# Patient Record
Sex: Female | Born: 1950
Health system: Southern US, Community
[De-identification: ages and names within clinical notes are randomized; demographics above are authoritative.]

## PROBLEM LIST (undated history)

## (undated) DIAGNOSIS — J45909 Unspecified asthma, uncomplicated: Secondary | ICD-10-CM

## (undated) DIAGNOSIS — C4491 Basal cell carcinoma of skin, unspecified: Secondary | ICD-10-CM

## (undated) DIAGNOSIS — K5900 Constipation, unspecified: Secondary | ICD-10-CM

## (undated) DIAGNOSIS — R7309 Other abnormal glucose: Secondary | ICD-10-CM

## (undated) DIAGNOSIS — I7300 Raynaud's syndrome without gangrene: Secondary | ICD-10-CM

## (undated) DIAGNOSIS — M1990 Unspecified osteoarthritis, unspecified site: Secondary | ICD-10-CM

## (undated) DIAGNOSIS — M5136 Other intervertebral disc degeneration, lumbar region: Secondary | ICD-10-CM

## (undated) DIAGNOSIS — R51 Headache: Secondary | ICD-10-CM

## (undated) DIAGNOSIS — G4700 Insomnia, unspecified: Secondary | ICD-10-CM

## (undated) DIAGNOSIS — K589 Irritable bowel syndrome without diarrhea: Secondary | ICD-10-CM

## (undated) DIAGNOSIS — R5383 Other fatigue: Secondary | ICD-10-CM

## (undated) DIAGNOSIS — M8580 Other specified disorders of bone density and structure, unspecified site: Secondary | ICD-10-CM

## (undated) DIAGNOSIS — F419 Anxiety disorder, unspecified: Secondary | ICD-10-CM

## (undated) DIAGNOSIS — F329 Major depressive disorder, single episode, unspecified: Secondary | ICD-10-CM

## (undated) DIAGNOSIS — F32A Depression, unspecified: Secondary | ICD-10-CM

## (undated) DIAGNOSIS — R519 Headache, unspecified: Secondary | ICD-10-CM

## (undated) DIAGNOSIS — K635 Polyp of colon: Secondary | ICD-10-CM

## (undated) DIAGNOSIS — M779 Enthesopathy, unspecified: Secondary | ICD-10-CM

## (undated) DIAGNOSIS — H9313 Tinnitus, bilateral: Secondary | ICD-10-CM

## (undated) DIAGNOSIS — L719 Rosacea, unspecified: Secondary | ICD-10-CM

## (undated) DIAGNOSIS — M51369 Other intervertebral disc degeneration, lumbar region without mention of lumbar back pain or lower extremity pain: Secondary | ICD-10-CM

## (undated) DIAGNOSIS — E785 Hyperlipidemia, unspecified: Secondary | ICD-10-CM

## (undated) DIAGNOSIS — G8929 Other chronic pain: Secondary | ICD-10-CM

## (undated) DIAGNOSIS — E871 Hypo-osmolality and hyponatremia: Secondary | ICD-10-CM

## (undated) DIAGNOSIS — G629 Polyneuropathy, unspecified: Secondary | ICD-10-CM

## (undated) HISTORY — PX: VAGINAL HYSTERECTOMY: SUR661

## (undated) HISTORY — PX: DIGIT NAIL REMOVAL: SHX5052

## (undated) HISTORY — DX: Constipation, unspecified: K59.00

## (undated) HISTORY — DX: Rosacea, unspecified: L71.9

## (undated) HISTORY — DX: Other specified disorders of bone density and structure, unspecified site: M85.80

## (undated) HISTORY — DX: Hypo-osmolality and hyponatremia: E87.1

## (undated) HISTORY — DX: Insomnia, unspecified: G47.00

## (undated) HISTORY — DX: Basal cell carcinoma of skin, unspecified: C44.91

## (undated) HISTORY — DX: Headache: R51

## (undated) HISTORY — DX: Enthesopathy, unspecified: M77.9

## (undated) HISTORY — PX: CARDIAC CATHETERIZATION: SHX172

## (undated) HISTORY — DX: Irritable bowel syndrome, unspecified: K58.9

## (undated) HISTORY — DX: Major depressive disorder, single episode, unspecified: F32.9

## (undated) HISTORY — DX: Polyneuropathy, unspecified: G62.9

## (undated) HISTORY — DX: Tinnitus, bilateral: H93.13

## (undated) HISTORY — DX: Other abnormal glucose: R73.09

## (undated) HISTORY — DX: Unspecified asthma, uncomplicated: J45.909

## (undated) HISTORY — DX: Other intervertebral disc degeneration, lumbar region without mention of lumbar back pain or lower extremity pain: M51.369

## (undated) HISTORY — PX: DENTAL SURGERY: SHX609

## (undated) HISTORY — DX: Anxiety disorder, unspecified: F41.9

## (undated) HISTORY — DX: Other intervertebral disc degeneration, lumbar region: M51.36

## (undated) HISTORY — DX: Headache, unspecified: R51.9

## (undated) HISTORY — PX: BUNIONECTOMY: SHX129

## (undated) HISTORY — DX: Raynaud's syndrome without gangrene: I73.00

## (undated) HISTORY — DX: Polyp of colon: K63.5

## (undated) HISTORY — DX: Depression, unspecified: F32.A

## (undated) HISTORY — DX: Other fatigue: R53.83

## (undated) HISTORY — DX: Hyperlipidemia, unspecified: E78.5

## (undated) HISTORY — PX: BREAST REDUCTION SURGERY: SHX8

## (undated) HISTORY — DX: Other chronic pain: G89.29

## (undated) HISTORY — PX: FACIAL COSMETIC SURGERY: SHX629

## (undated) HISTORY — DX: Unspecified osteoarthritis, unspecified site: M19.90

---

## 2000-10-13 ENCOUNTER — Other Ambulatory Visit: Admission: RE | Admit: 2000-10-13 | Discharge: 2000-10-13 | Payer: Self-pay | Admitting: Family Medicine

## 2001-03-16 ENCOUNTER — Ambulatory Visit (HOSPITAL_COMMUNITY): Admission: RE | Admit: 2001-03-16 | Discharge: 2001-03-16 | Payer: Self-pay | Admitting: Gastroenterology

## 2001-12-06 ENCOUNTER — Other Ambulatory Visit: Admission: RE | Admit: 2001-12-06 | Discharge: 2001-12-06 | Payer: Self-pay | Admitting: Family Medicine

## 2002-11-05 ENCOUNTER — Other Ambulatory Visit: Admission: RE | Admit: 2002-11-05 | Discharge: 2002-11-05 | Payer: Self-pay | Admitting: Family Medicine

## 2002-11-05 ENCOUNTER — Encounter: Admission: RE | Admit: 2002-11-05 | Discharge: 2002-11-05 | Payer: Self-pay | Admitting: Family Medicine

## 2002-11-05 ENCOUNTER — Encounter: Payer: Self-pay | Admitting: Family Medicine

## 2003-03-26 ENCOUNTER — Ambulatory Visit (HOSPITAL_COMMUNITY): Admission: RE | Admit: 2003-03-26 | Discharge: 2003-03-26 | Payer: Self-pay | Admitting: Plastic Surgery

## 2004-01-20 ENCOUNTER — Other Ambulatory Visit: Admission: RE | Admit: 2004-01-20 | Discharge: 2004-01-20 | Payer: Self-pay | Admitting: Family Medicine

## 2004-03-10 ENCOUNTER — Ambulatory Visit (HOSPITAL_COMMUNITY): Admission: RE | Admit: 2004-03-10 | Discharge: 2004-03-10 | Payer: Self-pay | Admitting: Orthopedic Surgery

## 2004-03-10 ENCOUNTER — Encounter (INDEPENDENT_AMBULATORY_CARE_PROVIDER_SITE_OTHER): Payer: Self-pay | Admitting: Specialist

## 2004-03-10 ENCOUNTER — Ambulatory Visit (HOSPITAL_BASED_OUTPATIENT_CLINIC_OR_DEPARTMENT_OTHER): Admission: RE | Admit: 2004-03-10 | Discharge: 2004-03-10 | Payer: Self-pay | Admitting: Orthopedic Surgery

## 2005-06-22 ENCOUNTER — Encounter: Admission: RE | Admit: 2005-06-22 | Discharge: 2005-06-22 | Payer: Self-pay | Admitting: Cardiology

## 2005-06-29 ENCOUNTER — Ambulatory Visit (HOSPITAL_COMMUNITY): Admission: RE | Admit: 2005-06-29 | Discharge: 2005-06-29 | Payer: Self-pay | Admitting: Cardiology

## 2005-07-07 ENCOUNTER — Ambulatory Visit (HOSPITAL_COMMUNITY): Admission: RE | Admit: 2005-07-07 | Discharge: 2005-07-07 | Payer: Self-pay | Admitting: Cardiology

## 2006-04-04 ENCOUNTER — Ambulatory Visit (HOSPITAL_COMMUNITY): Admission: RE | Admit: 2006-04-04 | Discharge: 2006-04-04 | Payer: Self-pay | Admitting: Plastic Surgery

## 2006-04-16 ENCOUNTER — Encounter (INDEPENDENT_AMBULATORY_CARE_PROVIDER_SITE_OTHER): Payer: Self-pay | Admitting: Plastic Surgery

## 2006-04-16 ENCOUNTER — Inpatient Hospital Stay (HOSPITAL_COMMUNITY): Admission: AD | Admit: 2006-04-16 | Discharge: 2006-04-19 | Payer: Self-pay | Admitting: Plastic Surgery

## 2006-11-16 ENCOUNTER — Ambulatory Visit (HOSPITAL_COMMUNITY): Admission: RE | Admit: 2006-11-16 | Discharge: 2006-11-16 | Payer: Self-pay | Admitting: Plastic Surgery

## 2009-06-04 ENCOUNTER — Encounter: Admission: RE | Admit: 2009-06-04 | Discharge: 2009-06-04 | Payer: Self-pay | Admitting: Family Medicine

## 2010-03-10 ENCOUNTER — Other Ambulatory Visit: Payer: Self-pay | Admitting: Dermatology

## 2010-06-11 NOTE — H&P (Signed)
NAME:  Nichole [name], Nichole [name]                     ACCOUNT NO.:  [id number]   MEDICAL RECORD NO.:  [id number]          PATIENT TYPE:  INP   LOCATION:  9319                          FACILITY:  [facility]   PHYSICIAN:  Lorra Hals, M.D.   DATE OF BIRTH:  [birth year]   DATE OF ADMISSION:  04/16/2006  DATE OF DISCHARGE:                              HISTORY & PHYSICAL   CHIEF COMPLAINT:  Life threatening wound of the right and left breast,  secondary to breast reduction performed 4 days ago.   HISTORY OF PRESENT ILLNESS:  History regarding breast reduction:  The  patient was seen in consultation regarding the classic symptoms of  macromastia, pain in the breast with and without her bra, pain in the  neck, back, shoulders and breasts due to abnormal posture. Painful  grooving and then abrasive dermatitis in the bra shoulder strap area  secondary to the massive weight of the breast being transposed to the  shoulder area, plus a chronic recurrent dermatitis in the infra-mammary  sulcus bilaterally. This procedure would have been covered by insurance,  if the insurance carriers no longer placed rather questionable  restrictions on their pre-authorization at this point in time. 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 This lady is 5 feet tall and weighs 120 pounds and  her breasts are quite massive for her small height and weight.   The breast reduction procedure performed 4 days ago was performed in the  office surgery center of Dr. Marijean Niemann, a reconstructive plastic  surgeon. The technique used was the typically performed procedure,  performed in [place], that being a de-epithelialized inferiorly  based pedicle, utilized for nipple areolar transposition. During the  procedure, the pedicle was maintained at a greater than adequate width  and thickness, and no torsion or  compression was placed on the pedicle  when the wounds were closed. In the postoperative period, the breasts  neither swelled abnormally, though there was not any additional  compression on the pedicle. The patient did, however, develop aggressive  cyanosis and ultimately ischemic necrosis of the right and left nipple  areolar complexes, and she was therefore admitted for urgent surgery for  wound exploration and debridement and to be begun on a wound VAC system  treatment.   REVIEW OF SYSTEMS:  HEENT:  No prior history of meningitis, seizures,  head injury, paralysis, stroke, vision difficulty, glaucoma, nose bleed,  or thyroid disorder.  HEART/LUNG:  No history of asthma, bronchitis, pneumonia, emphysema,  hemoptysis, shortness of breath, chest pain, angina, ankle swelling,  hypertension, or heart murmur.  ABDOMEN:  No history of gastric reflux, hiatal hernia, ulcers,  hematemesis, black or bloody stools, hepatitis, jaundice, gallbladder  disorder.  GENITOURINARY:  No difficulty in voiding, bloody urine, kidney disease,  or stones. The patient is a G2, P2, A0.   PAST MEDICAL HISTORY:  No major childhood diseases. No major adult  diseases.   PAST SURGICAL  HISTORY:  Wisdom teeth 1969. Hysterectomy in 1980. Bunion  in 2001. Facelift in 2005.   ALLERGIES:  None other than CODEINE (causes hallucinations.)   CURRENT MEDICATIONS:  Crestor 30 mg daily.   SOCIAL HISTORY:  The patient is married.   HABITS:  The patient does not smoke. Only drinks very rarely.   FAMILY HISTORY:  Father 58, deceased of lung and liver cancer. Mother  40, deceased. Two sisters, one 71 and one 69, both alive and well.   PHYSICAL EXAMINATION:  VITAL SIGNS:  Pulse 72, blood pressure 124/78,  respiratory rate 12. The patient is afebrile.  HEENT:  PERRLA. Nose clear. Oral clear.  NECK:  No thyroid enlargement or bruits.  BREASTS:  The patient presents with primary healing of the vertical  infra-areolar and  transverse infra-mammary wound incisions. There is  some ecchymosis and abnormal capillary filling of the lower breast  flaps. Both the right and left nipple areolar complexes demonstrate  ischemic necrosis.  LUNGS:  Clear to auscultation and percussion.  HEART:  Regular rhythm without murmurs. Size normal.  ABDOMEN:  No hernia. No liver or spleen enlargement. No adenopathy.  EXTREMITIES:  Within normal limits.  NEUROLOGIC:  Sensory sensorium, cerebellar, and DTR's are normal.   FINAL IMPRESSION:  The patient is 4 days postoperative bilateral breast  reduction for macromastia, for relief of pain symptoms noted above. The  patient has suffered ischemic necrosis of both right and left nipple  areolar complexes and is admitted for appropriate wound debridement and  wound management.     ______________________________  Lorra Hals, M.D.    ______________________________  Lorra Hals, M.D.    TRK/MEDQ  D:  04/16/2006  T:  04/16/2006  Job:  [id number]   cc:   Lorra Hals, M.D.  Fax: [phone]

## 2010-06-11 NOTE — Op Note (Signed)
[name], [name]                     ACCOUNT NO.:  [id number]   MEDICAL RECORD NO.:  [id number]          PATIENT TYPE:  AMB   LOCATION:  DSC                          FACILITY:  [facility]   PHYSICIAN:  Cindee Salt, M.D.       DATE OF BIRTH:  1950-05-14   DATE OF PROCEDURE:  03/10/2004  DATE OF DISCHARGE:                                 OPERATIVE REPORT   PREOPERATIVE DIAGNOSIS:  Mucoid cyst left thumb.   POSTOPERATIVE DIAGNOSIS:  Mucoid cyst left thumb.   OPERATION:  Excision mucoid cyst, debridement interphalangeal joint left  thumb.   SURGEON:  Cindee Salt, M.D.   ASSISTANTCarolyne Fiscal.   ANESTHESIA:  General.   HISTORY:  The patient is a 60 year old female with a history a large mass  dorsal aspect IP joint of her left thumb. X-rays revealed degenerative  arthritis.   PROCEDURE:  The patient is brought to the operating room where a general  anesthetic was carried out without difficulty. She was prepped using  DuraPrep, supine position, left arm free. The limb was exsanguinated with an  Esmarch bandage, tourniquet placed on the arm was inflated to 250 mmHg.  Curvilinear incision was made over the mass carried down on the radial  aspect of the proximal phalanx, carried down through subcutaneous tissue. A  large multilobulated cystic structure was immediately encountered the joint  was significantly swollen.  The cyst was excised with a rongeur. The joint  was opened and the radial aspect. The synovial tissue removed which was  abundant.  A large exostosis was present on the dorsal aspect of the  proximal phalanx with a small rongeur. This was removed without difficulty.  Smoothed over the dorsal aspect.  No further exostoses were identified. Both  specimens were sent to pathology. The wound was irrigated. The skin was then  closed interrupted 5-0 nylon sutures. Sterile compressive dressing and  splint to the thumb and was applied. The patient tolerated the procedure  well was taken to  the recovery observation in satisfactory condition. She is  discharged home to return to [facility] in one week on  Vicodin.      GK/MEDQ  D:  03/10/2004  T:  03/10/2004  Job:  [id number]

## 2010-06-11 NOTE — Cardiovascular Report (Signed)
NAMEREMI, RESTER                     ACCOUNT NO.:  [id number]   MEDICAL RECORD NO.:  [id number]          PATIENT TYPE:  OIB   LOCATION:  2899                         FACILITY:  [facility]   PHYSICIAN:  Thereasa Solo. Little, M.D. DATE OF BIRTH:  Jan 30, 1950   DATE OF PROCEDURE:  06/29/2005  DATE OF DISCHARGE:                              CARDIAC CATHETERIZATION   INDICATIONS FOR TEST:  This healthy 60 year old female who works out  regularly has had no problems with any limitations until about a month ago  when she began having marked dyspnea on exertion when she tried to hike up  [employer]  She had done this multiple times in the past with no  limitations.  Since that time, she has had dyspnea on exertion when she does  her normal without routine which includes using a treadmill.  She does not  have a history of COPD, does not smoke cigarettes and has no family history  of heart disease.   Her LDL was elevated at 151 on May 04, 2005.  She was started on both  Crestor and Niacin.   Patient is brought in for outpatient cardiac catheterization.   PROCEDURE:  After obtaining informed consent, the patient was prepped and  draped in the usual sterile fashion exposing the right groin.  Following  local anesthetic with 1% Xylocaine, the Seldinger technique was employed and  a 5 [place] introducer sheath was placed in the right femoral artery.  Left  and right coronary arteriography and ventriculography in the RAO projection  was performed.   COMPLICATIONS:  None.   EQUIPMENT:  5 French Judkins configuration catheters.   TOTAL CONTRAST:  70 mL.   MEDICATIONS:  IV Benadryl 25 mg   RESULTS:  1.  Hemodynamic monitoring:  Central aortic pressure was 126/63, left      ventricular pressure was 126/13 with no aortic valve gradient noted at      the time of pullback.  2.  Ventriculography:  Ventriculography in the RAO projection using 20 mL of      contrast at 12 mL per second revealed  normal left ventricular systolic      function.  No wall motion abnormalities were noted.  No mitral      regurgitation was seen.  The left ventricular end diastolic pressure was      9 and the ejection fraction was greater than 60%.  3.  Coronary arteriography:  On fluoroscopy, no calcification was noted in      the distribution of the coronary arteries.      1.  Left main normal.  It bifurcated.      2.  LAD:  The LAD approached the apex of the heart.  It was a large          vessel.  Just after the takeoff of the first diagonal was a focal          eccentric area of narrowing in the LAD.  It was not flow limiting.          The first  diagonal was a very large vessel and free of disease.      3.  Circumflex:  The circumflex was a very large vessel that gave rise          to a hybrid bifurcating OM system, all of which was free of disease.      4.  Right coronary artery:  The right coronary artery was a dominant          vessel that gave rise to a PDA and a large posterolateral vessel          that bifurcated.  It was free of disease.   CONCLUSION:  1.  Normal left ventricular systolic function.  2.  Minimal narrowing of the left anterior descending just after the      bifurcation of the first diagonal.  3.  Normotensive.   I cannot explain her dyspnea on exertion from a cardiac standpoint.  I will  order pulmonary function studies with and without bronchodilators as an  outpatient.  She will need her LDL to be rechecked in about four weeks and  my goal would be an LDL of 70.           ______________________________  Thereasa Solo. Little, M.D.     ABL/MEDQ  D:  06/29/2005  T:  06/29/2005  Job:  [id number]   cc:   Cardiac Cath Lab   Talmadge Coventry, M.D.  Fax: [phone]

## 2010-06-11 NOTE — Op Note (Signed)
NAME:  Nichole [name], Nichole [name]                     ACCOUNT NO.:  [id number]   MEDICAL RECORD NO.:  [id number]          PATIENT TYPE:  INP   LOCATION:  9319                          FACILITY[facility]   PHYSICIAN:  Lorra Hals, M.D.   DATE OF BIRTH:  Sep 26, 1950   DATE OF PROCEDURE:  DATE OF DISCHARGE:                               OPERATIVE REPORT   A 60 year old patient.   PREOPERATIVE DIAGNOSIS:  Patient suffers from bilateral necrosis of  nipple areolar pedicle utilized for bilateral breast reduction,  approximately seven days ago.  Forty eight hours ago, she underwent  debridement of the nipple areolar pedicle tissues on the right and left  breast and activation of a wound vac system.  She has rested well thus  far with minimal drainage from the right and left breast wounds.  Quiescent, noninflamed with no signs of infection.  The patient has been  afebrile during her hospital course.   POSTOPERATIVE DIAGNOSIS:  Patient suffers from bilateral necrosis of  nipple areolar pedicle utilized for bilateral breast reduction,  approximately seven days ago.  The wound is extremely clean.  There is  no evidence of any bacteria or infection.   OPERATION:  1. Removal of previously placed wound vac system.  2. Wound exploration with very minimal debridement and reapplication      of wound vac system.   ANESTHESIA:  General via LMA.   SURGEON:  Lorra Hals, M.D.   PROCEDURE:  After a satisfactory level of general anesthesia was  achieved, the wound vac system was removed, and the entire anterior  chest prepped with multiple layers of Betadine, and a sterile field  created about the anterior chest.   FINDINGS:  This lady presents with a 4.5 x 4 x 2 to 2.5 cm deep open  wound at the nipple areolar recipient site of the right and left breast.  The underlying tissues, which are primarily fatty tissues, are viable  with no evidence of any progressive process from her previous  debridement 48 hours ago.   The wound cavities are explored thoroughly,  irrigated with copious amount of saline, and the wound was then repacked  with the black wound foam deep, and the white wound foam superficially.  Sticky drapes are then applied to close the wound vac system.  The  patient tolerated the procedure well.  Estimated blood loss 5 cc.           ______________________________  Lorra Hals, M.D.     TRK/MEDQ  D:  04/18/2006  T:  04/18/2006  Job:  [id number]

## 2010-06-11 NOTE — Discharge Summary (Signed)
NAME:  Nichole [name], Nichole [name]                     ACCOUNT NO.:  [id number]   MEDICAL RECORD NO.:  [id number]          PATIENT TYPE:  INP   LOCATION:  9319                          [facility]   PHYSICIAN:  Lorra Hals, M.D.   DATE OF BIRTH:  October 02, 1950   DATE OF ADMISSION:  04/16/2006  DATE OF DISCHARGE:  04/19/2006                               DISCHARGE SUMMARY   CHIEF COMPLAINT:  Life-threatening wound, right and left breasts  secondary to breast reduction four days prior to admission.   HISTORY OF PRESENT ILLNESS:  The patient was seen in consultation  regarding the classic symptoms of macromastia, pain in the breast with  and without her bra, pain in the neck, back, and shoulders, and breasts  due to the weight of the breast and her abnormal posture secondary to  the weight of the breast.  Painful grooving plus and abrasive type  dermatitis in the bra shoulder strap area secondary to the massive  weight of the breasts being transposed to the bra shoulder strap area,  plus a chronic recurrent dermatitis in the inframammary sulcus  bilaterally during the warm summer months.  This lady is 5 feet tall and  weighs 120 pounds and her breasts are quite massive for her short height  and weight.  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  On April 12, 2006, the patient underwent a classic keyhole mastopexy  utilizing an inferiorly based de-epithelialized pedicle for nipple  areolar transposition.  She then developed ischemia of the pedicle and  ultimately developed necrosis of the tip of the pedicle utilized for  nipple areolar transposition.  She was then admitted on  April 16, 2006,  for urgent wound exploration and appropriate treatment.   HOSPITAL COURSE:  On the day of the admission, the patient was taken to  the operating theater and with Dr. Aquilla Hacker assisting, under general  anesthesia, both wounds were explored.  The distal third of each pedicle  utilized for nipple areolar transposition was necrotic and both were  debrided.  The patient was then placed on a Wound VAC system and  appropriate wound cultures sent for aerobic and anaerobic cultures.  The patient remained afebrile postoperatively for the first 48 hours and  was returned on April 18, 2006, for secondary wound exploration.  Under  general anesthesia the VAC system was removed and the wounds re-explored  and further debridement carried out until all tissues appeared to be  viable.  She was then re-placed on the Wound VAC system. All aerobic and  anaerobic cultures remained negative for the following 24 hours after  her second surgery.  She remained afebrile and the wounds remained  quiescent, and she was then ready for discharge.   LABORATORY  FINDINGS:  Pathology report revealed devitalized skin with  vascular congestion and inflammation with no evidence of malignancy on  either of the products of the debridement of the primary and secondary  debridement surgeries.  Admission white count 8, hemoglobin 12.8.   FINAL DIAGNOSIS:  Secondary wound necrosis secondary to breast reduction  requiring surgical debridement and wound care via Wound VAC system.   PLAN:  Monday, Wednesday and Friday [facility] system changed and followup care  per the office of Dr. Marijean Niemann.           ______________________________  Lorra Hals, M.D.     TRK/MEDQ  D:  04/28/2006  T:  04/28/2006  Job:  [id number]

## 2010-06-11 NOTE — Op Note (Signed)
NAME:  Nichole [name], Nichole [name]                     ACCOUNT NO.:  [id number]   MEDICAL RECORD NO.:  [id number]          PATIENT TYPE:  INP   LOCATION:  9198                          [facility]   PHYSICIAN:  Lorra Hals, M.D.   DATE OF BIRTH:  [birth year]   DATE OF PROCEDURE:  04/16/2006  DATE OF DISCHARGE:                               OPERATIVE REPORT   A 60 year old female.   PREOPERATIVE DIAGNOSIS:  This lady underwent a bilateral breast  reduction for pain in the breasts, pain in the neck, back and the  shoulders secondary to the weight of the breasts, deep grooving and pain  in the shoulder bra strap area secondary to the weight of the breasts,  plus a chronic recurrent dermatitis in the inframammary sulcus of both  breasts.  This breast reduction procedure was carried out four days ago  utilizing an inferiorly based deepithelialized pedicle for nipple  areolar transposition.  Postoperatively, she developed cyanosis of the  nipple areolar complex and was begun on one adult aspirin a day and 5000  units of USP heparin and topical 2% nitroglycerin cream.  In spite of  these measures, these tissues have gone on to become severely ischemic.   POSTOPERATIVE DIAGNOSIS:  Ischemic necrosis of the distal half of the  pedicle/nipple areolar complex.   OPERATION:  Exploration of the right and left breast with debridement of  necrotic tissue and fixation of wound VAC device to the right and the  left breast.   ANESTHESIA:  General via LMA.   SURGEON:  Kitchens.   ASSISTANT SURGEON:  Dr. Aquilla Hacker.   PROCEDURE:  After a satisfactory level of general anesthesia was  achieved, the entire anterior chest was prepped with multiple layers of  Betadine and a sterile field created about the anterior chest.   FINDINGS:  The incisional wounds in the transverse inframammary sulcus  and infra-areolar wounds are healing per primum, though some mild  ecchymoses and poor circulation is visible in these  flaps, which is  quite unusual.  Both the right and the left nipple areolar complexes are  quite dusky and exhibit no significant circulation when pricked with an  18 gauge needle.   The first stage of the procedure consisted of removing the sutures about  each nipple areolar complex and the nipple recipient site.  The pedicles  were then advanced out of these wounds and very slowly and meticulously  debrided of nonviable tissues until an end point was reached in both the  right and left pedicle, which showed normal bleeding/viability.  Approximately the distal half of each pedicle was resected along with  the entire nipple areolar complex on both the right and left breast.  Aerobic and anaerobic cultures were taken of both the right and left  breast.   The wounds were irrigated with a copious amount of saline.  Very little  bleeding was noted even from the normal non-pedicle tissues, which is  somewhat unusual since the patient has been on aspirin for the last 4  days.   Next, a  wound VAC sponge was placed within each nipple areolar recipient  site wound and the appropriate sticky drapes applied.  This wound will  be treated with the wound VAC system postop.   The patient tolerated the procedure well.   ESTIMATED BLOOD LOSS:  10 mL.           ______________________________  Lorra Hals, M.D.     TRK/MEDQ  D:  04/16/2006  T:  04/16/2006  Job:  [id number]   cc:   Aquilla Hacker, M.D.  Fax: [phone]

## 2011-10-06 DIAGNOSIS — R7301 Impaired fasting glucose: Secondary | ICD-10-CM | POA: Insufficient documentation

## 2011-10-06 DIAGNOSIS — G479 Sleep disorder, unspecified: Secondary | ICD-10-CM | POA: Insufficient documentation

## 2011-10-06 DIAGNOSIS — I7300 Raynaud's syndrome without gangrene: Secondary | ICD-10-CM | POA: Insufficient documentation

## 2011-10-06 DIAGNOSIS — E785 Hyperlipidemia, unspecified: Secondary | ICD-10-CM | POA: Insufficient documentation

## 2011-10-06 DIAGNOSIS — G4700 Insomnia, unspecified: Secondary | ICD-10-CM | POA: Insufficient documentation

## 2011-10-06 DIAGNOSIS — F419 Anxiety disorder, unspecified: Secondary | ICD-10-CM | POA: Insufficient documentation

## 2011-10-06 DIAGNOSIS — K5909 Other constipation: Secondary | ICD-10-CM | POA: Insufficient documentation

## 2012-05-02 DIAGNOSIS — M778 Other enthesopathies, not elsewhere classified: Secondary | ICD-10-CM

## 2012-05-02 HISTORY — DX: Other enthesopathies, not elsewhere classified: M77.8

## 2012-10-08 ENCOUNTER — Encounter: Payer: Self-pay | Admitting: Podiatry

## 2012-10-24 ENCOUNTER — Ambulatory Visit (INDEPENDENT_AMBULATORY_CARE_PROVIDER_SITE_OTHER): Payer: 59 | Admitting: Podiatry

## 2012-10-24 ENCOUNTER — Encounter: Payer: Self-pay | Admitting: Podiatry

## 2012-10-24 DIAGNOSIS — M779 Enthesopathy, unspecified: Secondary | ICD-10-CM

## 2012-10-24 DIAGNOSIS — G5761 Lesion of plantar nerve, right lower limb: Secondary | ICD-10-CM

## 2012-10-24 DIAGNOSIS — G5760 Lesion of plantar nerve, unspecified lower limb: Secondary | ICD-10-CM

## 2012-10-24 NOTE — Patient Instructions (Signed)
Call if pain gets worse  

## 2012-10-24 NOTE — Progress Notes (Signed)
Subjective:     Patient ID: Nichole [name], female   DOB: [birth year], 62 y.o.   MRN: [id number]  HPIpain in both feet and orthotics are sore in two spots   Review of Systems  All other systems reviewed and are negative.       Objective:   Physical Exam  Cardiovascular: Intact distal pulses.    upon palpation I noted there to still be pain in the lesser metatarsals I and lateral. I checked for neuroma symptoms right and did not find them to be present.     Assessment:    continued low grade pain of the feet in general. Most likely neuropathy-like symptoms. Mild capsulitis of the MPJs still present.      Plan:     Continue Neurontin 100 mg twice a day. Continue alcohol lipoic acid and vitamin D. complexes. Consider advanced treatment of neuropathy with neurogenic status at one point in the future and educated her of this possible treatment.

## 2013-02-21 ENCOUNTER — Other Ambulatory Visit: Payer: Self-pay | Admitting: Podiatry

## 2013-02-27 ENCOUNTER — Encounter: Payer: Self-pay | Admitting: Podiatry

## 2013-02-27 ENCOUNTER — Ambulatory Visit (INDEPENDENT_AMBULATORY_CARE_PROVIDER_SITE_OTHER): Payer: 59 | Admitting: Podiatry

## 2013-02-27 VITALS — BP 126/65 | HR 67 | Resp 16 | Ht 60.0 in | Wt 144.0 lb

## 2013-02-27 DIAGNOSIS — G589 Mononeuropathy, unspecified: Secondary | ICD-10-CM

## 2013-02-27 DIAGNOSIS — M779 Enthesopathy, unspecified: Secondary | ICD-10-CM

## 2013-02-27 NOTE — Progress Notes (Signed)
Pt states the burning is not as bad, some rocky feeling areas.

## 2013-02-28 NOTE — Progress Notes (Signed)
Subjective:     Patient ID: Nichole [name], female   DOB: 09-Jan-1951, 63 y.o.   MRN: [id number]  HPI patient states that her burning is about 90% better and she is able to walk without significant discomfort   Review of Systems     Objective:   Physical Exam Neurovascular status unchanged with discomfort in the plantar aspect of both feet and burning-like symptoms which has improved by about 90%    Assessment:     Improvement with utilization of Neurontin and neuro remedy  medication    Plan:     Instructed on continuations of Neurontin and 100 mg twice a day and neuro remedy medication

## 2013-05-21 ENCOUNTER — Encounter: Payer: Self-pay | Admitting: Obstetrics & Gynecology

## 2013-05-21 ENCOUNTER — Ambulatory Visit (INDEPENDENT_AMBULATORY_CARE_PROVIDER_SITE_OTHER): Payer: 59 | Admitting: Obstetrics & Gynecology

## 2013-05-21 VITALS — BP 124/80 | HR 76 | Ht 59.5 in | Wt 135.8 lb

## 2013-05-21 DIAGNOSIS — Z01419 Encounter for gynecological examination (general) (routine) without abnormal findings: Secondary | ICD-10-CM

## 2013-05-21 NOTE — Progress Notes (Signed)
63 y.o. [id number] MarriedCaucasianF here for annual exam.  No vaginal bleeding.    No LMP recorded. Patient has had a hysterectomy.          Sexually active: yes  The current method of family planning is PMP.    Exercising: yes  walking Smoker:  no  Health Maintenance: Pap:  years History of abnormal Pap:  no MMG:  [place], 9 or 10/14 Colonoscopy:  Dr. Earlean Shawl BMD:  Years ago TDaP:  Unsure Screening Labs: Dr. Minna Antis, Hb today: Dr. Minna Antis, Urine today: Dr. Minna Antis   reports that she has never smoked. She has never used smokeless tobacco. She reports that she does not drink alcohol or use illicit drugs.  Past Medical History  Diagnosis Date  . Capsulitis of foot 05/02/2012    acute capsulitis 2nd mpj bilateral .  . Osteoarthritis   . Raynaud's disease /phenomenon   . IBS (irritable bowel syndrome)   . Depression (emotion)   . Anxiety   . Chronic headaches   . Fatigue   . Rosacea     Past Surgical History  Procedure Laterality Date  . Bunionectomy Bilateral   . Vaginal hysterectomy N/A   . Breast reduction surgery N/A   . Digit nail removal Right     fingernail removed 4th    Current Outpatient Prescriptions  Medication Sig Dispense Refill  . buPROPion (WELLBUTRIN) 100 MG tablet Take 150 mg by mouth daily. Unknown dose      . escitalopram (LEXAPRO) 10 MG tablet Take 10 mg by mouth daily. Unknown dose      . gabapentin (NEURONTIN) 100 MG capsule TAKE ONE CAPSULE BY MOUTH TWICE A DAY  60 capsule  5  . loratadine (CLARITIN) 10 MG tablet Take 10 mg by mouth daily. Pt breaks tablet into fourths and only takes one fourth.      . rosuvastatin (CRESTOR) 10 MG tablet Take 10 mg by mouth every other day. Unknown dose      . Tapentadol HCl (NUCYNTA PO) Take 1 tablet by mouth at bedtime.      Marland Kitchen zolpidem (AMBIEN CR) 12.5 MG CR tablet Take 10 mg by mouth at bedtime as needed for sleep (half a tablet). Unknown dose      . amLODipine (NORVASC) 2.5 MG tablet       . FINACEA 15 % cream        No  current facility-administered medications for this visit.    Family History  Problem Relation Age of Onset  . Dementia Mother   . Cancer Father   . Hypertension Father   . Fibromyalgia Sister   . Heart attack Maternal Aunt   . Heart attack Maternal Uncle   . Cancer Paternal Aunt   . Breast cancer Paternal Aunt   . Rheum arthritis Paternal Grandmother   . Hypertension Sister   . Breast cancer Cousin   . Heart attack Maternal Uncle     ROS:  Pertinent items are noted in HPI.  Otherwise, a comprehensive ROS was negative.  Exam:   BP 124/80  Pulse 76  Ht 4' 11.5" (1.511 m)  Wt 135 lb 12.8 oz (61.598 kg)  BMI 26.98 kg/m2   Height: 4' 11.5" (151.1 cm)  Ht Readings from Last 3 Encounters:  05/21/13 4' 11.5" (1.511 m)  02/27/13 5' (1.524 m)  05/02/12 5' (1.524 m)    General appearance: alert, cooperative and appears stated age Head: Normocephalic, without obvious abnormality, atraumatic Neck: no adenopathy, supple, symmetrical, trachea  midline and thyroid normal to inspection and palpation Lungs: clear to auscultation bilaterally Breasts: normal appearance, no masses or tenderness, absent nipples and areola, mild concave contour across top of breasts Heart: regular rate and rhythm Abdomen: soft, non-tender; bowel sounds normal; no masses,  no organomegaly Extremities: extremities normal, atraumatic, no cyanosis or edema Skin: Skin color, texture, turgor normal. No rashes or lesions Lymph nodes: Cervical, supraclavicular, and axillary nodes normal. No abnormal inguinal nodes palpated Neurologic: Grossly normal   Pelvic: External genitalia:  no lesions              Urethra:  normal appearing urethra with no masses, tenderness or lesions              Bartholins and Skenes: normal                 Vagina: normal appearing vagina with normal color and discharge, no lesions              Cervix: absent              Pap taken: no Bimanual Exam:  Uterus:  uterus absent               Adnexa: no mass, fullness, tenderness               Rectovaginal: Confirms               Anus:  normal sphincter tone, no lesions  A:  Well Woman with normal exam PMP, no HRT H/O breast reduction with complications, now with no nipples or areolas bilaterally Bilateral feet neuropathy Depression/anxiety S/P TVH, ovaries remain.  Surgery done due to bleeding.  P:   Mammogram discussed.  Recommend 3D due to breast scarring.  Release signed.  Will need to schedule MMG and BMD for pt. pap smear no indicated Release for colonoscopy obtained.   Labs/routine care/vaccines with Dr. Minna Antis return annually or prn  An After Visit Summary was printed and given to the patient.

## 2013-05-21 NOTE — Patient Instructions (Signed)

## 2013-05-23 ENCOUNTER — Encounter: Payer: Self-pay | Admitting: Podiatry

## 2013-05-23 ENCOUNTER — Ambulatory Visit (INDEPENDENT_AMBULATORY_CARE_PROVIDER_SITE_OTHER): Payer: 59

## 2013-05-23 ENCOUNTER — Ambulatory Visit (INDEPENDENT_AMBULATORY_CARE_PROVIDER_SITE_OTHER): Payer: 59 | Admitting: Podiatry

## 2013-05-23 VITALS — BP 102/55 | HR 78 | Resp 16

## 2013-05-23 DIAGNOSIS — M779 Enthesopathy, unspecified: Secondary | ICD-10-CM

## 2013-05-23 DIAGNOSIS — M21619 Bunion of unspecified foot: Secondary | ICD-10-CM

## 2013-05-23 DIAGNOSIS — G5760 Lesion of plantar nerve, unspecified lower limb: Secondary | ICD-10-CM

## 2013-05-23 MED ORDER — TRIAMCINOLONE ACETONIDE 10 MG/ML IJ SUSP
10.0000 mg | Freq: Once | INTRAMUSCULAR | Status: AC
  Administered 2013-05-23: 10 mg

## 2013-05-24 NOTE — Progress Notes (Signed)
Subjective:     Patient ID: Nichole [name], female   DOB: 01-Jun-1950, 63 y.o.   MRN: [id number]  Foot Pain   patient states I have pain in the outside of my right foot with fluid buildup and I'm doing a lot of shooting pain between the third and fourth toes of my left foot. States it's been present for several months and becoming increasingly hard to wear shoe gear comfortably   Review of Systems  All other systems reviewed and are negative.      Objective:   Physical Exam  Nursing note and vitals reviewed. Constitutional: She is oriented to person, place, and time.  Cardiovascular: Intact distal pulses.   Neurological: She is oriented to person, place, and time.  Skin: Skin is warm.   neurovascular status intact with normal muscle strength and range of motion. Patient has redness and discomfort with fluid buildup around fifth MPJ head right and on the left foot I noted shooting pain between the third and fourth toe with radiating discomfort into the adjacent digits     Assessment:     Probable tailor's bunion with inflammatory capsulitis right and neuroma symptomatology left that has been treated on the right foot several years ago    Plan:     H&P and x-rays reviewed and careful injection around the right fifth MPJ 3 mg dexamethasone Kenalog 5 mg Xylocaine and for the left I discussed and explained neuro lysis injection and did a sterile prep and injected directly into the nerve root prior to breaking into digital branches 4% behind her Darco Marcaine with epinephrine

## 2013-06-10 ENCOUNTER — Encounter: Payer: Self-pay | Admitting: Podiatry

## 2013-06-10 ENCOUNTER — Ambulatory Visit (INDEPENDENT_AMBULATORY_CARE_PROVIDER_SITE_OTHER): Payer: Medicare Other | Admitting: Podiatry

## 2013-06-10 VITALS — BP 121/70 | HR 72 | Resp 16

## 2013-06-10 DIAGNOSIS — G5760 Lesion of plantar nerve, unspecified lower limb: Secondary | ICD-10-CM

## 2013-06-10 DIAGNOSIS — M779 Enthesopathy, unspecified: Secondary | ICD-10-CM

## 2013-06-10 NOTE — Progress Notes (Signed)
Subjective:     Patient ID: Nichole [name], female   DOB: 08/19/1950, 63 y.o.   MRN: [id number]  HPI patient presents injection really helped my forefoot but the pain has returned   Review of Systems     Objective:   Physical Exam Neurovascular status intact well oriented x3 with exquisite discomfort third interspace right foot upon palpation    Assessment:     Probable neuroma symptomatology right    Plan:     Educated patient on this and initiated neuro lysis treatment consisting of 1.3 cc of purified D. hydrated alcohol and Marcaine

## 2013-06-13 ENCOUNTER — Ambulatory Visit: Payer: 59 | Admitting: Podiatry

## 2013-07-04 ENCOUNTER — Ambulatory Visit (INDEPENDENT_AMBULATORY_CARE_PROVIDER_SITE_OTHER): Payer: Medicare Other | Admitting: Podiatry

## 2013-07-04 ENCOUNTER — Encounter: Payer: Self-pay | Admitting: Podiatry

## 2013-07-04 VITALS — BP 135/75 | HR 68 | Resp 16

## 2013-07-04 DIAGNOSIS — G5760 Lesion of plantar nerve, unspecified lower limb: Secondary | ICD-10-CM

## 2013-07-04 NOTE — Progress Notes (Signed)
Subjective:     Patient ID: Nichole [name], female   DOB: 1950/06/29, 63 y.o.   MRN: [id number]  HPI patient states she is still getting some shooting pain in the third interspace but it is continuing to improve   Review of Systems     Objective:   Physical Exam Neurovascular status intact with improved nerve pain third interspace right foot    Assessment:     Neuroma which is improving on the right foot    Plan:     Reviewed condition and recommended continuation of conservative care sterile prep done injected the right third interspace with a pure 54% dehydration L.: Marcaine solution which patient tolerated well

## 2013-07-04 NOTE — Progress Notes (Deleted)
Subjective:     Patient ID: Nichole [name], female   DOB: August 11, 1950, 63 y.o.   MRN: [id number]  HPI  Pt is still having pain in right foot/ 3rd toe joint area. Noticed some improvement in pain for 2 weeks following injection. Review of Systems     Objective:   Physical Exam     Assessment:     ***    Plan:     ***

## 2013-07-09 ENCOUNTER — Other Ambulatory Visit: Payer: Self-pay | Admitting: *Deleted

## 2013-07-09 NOTE — Telephone Encounter (Signed)
Refill request for Gabapentin 100mg , take 1 capsule twice daily, 90 day supply.  Dr. Josephina Shih the refill.

## 2013-07-24 ENCOUNTER — Ambulatory Visit (INDEPENDENT_AMBULATORY_CARE_PROVIDER_SITE_OTHER): Payer: Medicare Other | Admitting: Podiatry

## 2013-07-24 ENCOUNTER — Encounter: Payer: Self-pay | Admitting: Podiatry

## 2013-07-24 VITALS — BP 105/58 | HR 69 | Resp 16

## 2013-07-24 DIAGNOSIS — G5761 Lesion of plantar nerve, right lower limb: Secondary | ICD-10-CM

## 2013-07-24 DIAGNOSIS — G5760 Lesion of plantar nerve, unspecified lower limb: Secondary | ICD-10-CM

## 2013-07-25 NOTE — Progress Notes (Signed)
Subjective:     Patient ID: Nichole [name], female   DOB: May 25, 1950, 63 y.o.   MRN: [id number]  HPI patient states that I have had irritation from the medicine and while I'm still having pain is nowhere near as bad on my right foot   Review of Systems     Objective:   Physical Exam Neurovascular status intact with discomfort third interspace right which continues to be present with a positive Biagio Borg sign but diminished from previous visits    Assessment:     Neuroma symptoms which are present but seem to be improving right    Plan:     Reviewed condition advised on anti-inflammatories supportive shoes and if symptoms continue to persist we'll need to consider surgery. Educated her on surgery

## 2013-08-31 ENCOUNTER — Other Ambulatory Visit: Payer: Self-pay | Admitting: Podiatry

## 2013-11-25 ENCOUNTER — Encounter: Payer: Self-pay | Admitting: Podiatry

## 2014-03-18 ENCOUNTER — Other Ambulatory Visit: Payer: Self-pay | Admitting: Podiatry

## 2014-05-27 ENCOUNTER — Ambulatory Visit: Payer: 59 | Admitting: Obstetrics & Gynecology

## 2014-08-08 ENCOUNTER — Encounter: Payer: Self-pay | Admitting: Obstetrics & Gynecology

## 2014-08-08 ENCOUNTER — Ambulatory Visit (INDEPENDENT_AMBULATORY_CARE_PROVIDER_SITE_OTHER): Payer: Medicare Other | Admitting: Obstetrics & Gynecology

## 2014-08-08 VITALS — BP 118/70 | HR 64 | Resp 16 | Ht 59.75 in | Wt 132.0 lb

## 2014-08-08 DIAGNOSIS — Z124 Encounter for screening for malignant neoplasm of cervix: Secondary | ICD-10-CM

## 2014-08-08 DIAGNOSIS — Z01419 Encounter for gynecological examination (general) (routine) without abnormal findings: Secondary | ICD-10-CM | POA: Diagnosis not present

## 2014-08-08 MED ORDER — ROSUVASTATIN CALCIUM 10 MG PO TABS: 10.0000 mg | ORAL_TABLET | Freq: Every day | ORAL | Status: DC

## 2014-08-08 MED ORDER — BUPROPION HCL ER (SR) 150 MG PO TB12: 150.0000 mg | ORAL_TABLET | Freq: Two times a day (BID) | ORAL | Status: DC

## 2014-08-08 MED ORDER — AMLODIPINE BESYLATE 2.5 MG PO TABS: 2.5000 mg | ORAL_TABLET | Freq: Every day | ORAL | Status: DC

## 2014-08-08 MED ORDER — ZOLPIDEM TARTRATE 10 MG PO TABS
ORAL_TABLET | ORAL | Status: DC
Start: 2014-08-08 — End: 2015-02-03

## 2014-08-08 NOTE — Progress Notes (Signed)
64 y.o. [id number] MarriedCaucasianF here for annual exam.  Doing well.  No vaginal bleeding.  Reports she has stopped her Lexapro.  Using only Wellbutrin 100mg .    Reports having BMD done at Dr. Wilmon Pali office.  Has not gotten any results.  PCP:  Dr. Minna Antis.  Saw him one month before he left in April.    No LMP recorded. Patient has had a hysterectomy.          Sexually active: Yes.    The current method of family planning is status post hysterectomy.    Exercising: No.  not regularly Smoker:  no  Health Maintenance: Pap:  Years ago History of abnormal Pap:  no MMG:  06/20/14 3D-normal Colonoscopy:  2015-Dr Medoff BMD:   05/14/14-Dr Pang's office-faxing results  TDaP:  4/16 Screening Labs: PCP, Hb today: PCP, Urine today: PCP   reports that she has never smoked. She has never used smokeless tobacco. She reports that she does not drink alcohol or use illicit drugs.  Past Medical History  Diagnosis Date  . Capsulitis of foot 05/02/2012    acute capsulitis 2nd mpj bilateral .  . Osteoarthritis   . Raynaud's disease /phenomenon   . IBS (irritable bowel syndrome)   . Depression (emotion)   . Anxiety   . Chronic headaches   . Fatigue   . Rosacea   . Neuropathy     Past Surgical History  Procedure Laterality Date  . Bunionectomy Bilateral   . Vaginal hysterectomy N/A   . Breast reduction surgery N/A     complications of blood supply  . Digit nail removal Right     fingernail removed 4th    Current Outpatient Prescriptions  Medication Sig Dispense Refill  . amLODipine (NORVASC) 2.5 MG tablet     . azelaic acid (AZELEX) 20 % cream     . buPROPion (WELLBUTRIN) 100 MG tablet Take 150 mg by mouth daily. Unknown dose    . FINACEA 15 % cream     . gabapentin (NEURONTIN) 100 MG capsule TAKE ONE CAPSULE BY MOUTH TWICE A DAY 60 capsule 5  . loratadine (CLARITIN) 10 MG tablet Take 10 mg by mouth daily. Pt breaks tablet into fourths and only takes one fourth.    Marland Kitchen MAGNESIUM OXIDE,  ANTACID, PO Take by mouth.    . Polyethylene Glycol 3350 (MIRALAX PO) Take by mouth.    . rosuvastatin (CRESTOR) 10 MG tablet Take 10 mg by mouth every other day. Unknown dose    . Tapentadol HCl (NUCYNTA PO) Take 1 tablet by mouth at bedtime.    Marland Kitchen zolpidem (AMBIEN CR) 12.5 MG CR tablet Take 10 mg by mouth at bedtime as needed for sleep (half a tablet). Unknown dose    . escitalopram (LEXAPRO) 10 MG tablet Take 10 mg by mouth daily. Unknown dose    . lidocaine (LIDODERM) 5 % Place 1 patch onto the skin.     No current facility-administered medications for this visit.    Family History  Problem Relation Age of Onset  . Dementia Mother   . Cancer Father   . Hypertension Father   . Fibromyalgia Sister   . Heart attack Maternal Aunt   . Heart attack Maternal Uncle   . Cancer Paternal Aunt   . Breast cancer Paternal Aunt   . Rheum arthritis Paternal Grandmother   . Hypertension Sister   . Breast cancer Cousin   . Heart attack Maternal Uncle     ROS:  Pertinent items are noted in HPI.  Otherwise, a comprehensive ROS was negative.  Exam:   BP 118/70 mmHg  Pulse 64  Resp 16  Ht 4' 11.75" (1.518 m)  Wt 132 lb (59.875 kg)  BMI 25.98 kg/m2  Weight change: @WEIGHTCHANGE @ Height:   Height: 4' 11.75" (151.8 cm)  Ht Readings from Last 3 Encounters:  08/08/14 4' 11.75" (1.518 m)  05/21/13 4' 11.5" (1.511 m)  02/27/13 5' (1.524 m)    General appearance: alert, cooperative and appears stated age Head: Normocephalic, without obvious abnormality, atraumatic Neck: no adenopathy, supple, symmetrical, trachea midline and thyroid normal to inspection and palpation Lungs: clear to auscultation bilaterally Breasts: normal appearance, no masses or tenderness, well healed scars present, no nipples Heart: regular rate and rhythm Abdomen: soft, non-tender; bowel sounds normal; no masses,  no organomegaly Extremities: extremities normal, atraumatic, no cyanosis or edema Skin: Skin color, texture,  turgor normal. No rashes or lesions Lymph nodes: Cervical, supraclavicular, and axillary nodes normal. No abnormal inguinal nodes palpated Neurologic: Grossly normal   Pelvic: External genitalia:  no lesions              Urethra:  normal appearing urethra with no masses, tenderness or lesions              Bartholins and Skenes: normal                 Vagina: normal appearing vagina with normal color and discharge, no lesions              Cervix: absent              Pap taken: No. Bimanual Exam:  Uterus:  uterus absent              Adnexa: no mass, fullness, tenderness               Rectovaginal: Confirms               Anus:  normal sphincter tone, no lesions  Chaperone was present for exam.  A:  Well Woman with normal exam PMP, no HRT H/O breast reduction with complications, now with no nipples or areolas bilaterally Bilateral feet neuropathy Depression/anxiety S/P TVH, ovaries remain, done for bleeding issues  P: Mammogram discussed. Recommend 3D due to breast scarring. pap smear no indicated Rx needed for Norvasc 2.5mg  daily prn.  #90/4RF, Ambien 10mg  1/2 tab qhs prn #30/5RF, Crestor 10mg  daily.  #90/4RF, Wellbrutrin SR 150mg  bid.  #180/4RF.  Pt will confirm the wellbutrin dosage and call if not correct.  Labs/routine care/vaccines with Dr. Minna Antis return annually or prn

## 2014-10-19 ENCOUNTER — Other Ambulatory Visit: Payer: Self-pay | Admitting: Podiatry

## 2014-10-20 ENCOUNTER — Other Ambulatory Visit: Payer: Self-pay | Admitting: Podiatry

## 2014-10-20 ENCOUNTER — Telehealth: Payer: Self-pay | Admitting: *Deleted

## 2014-10-20 MED ORDER — GABAPENTIN 100 MG PO CAPS: 100.0000 mg | ORAL_CAPSULE | Freq: Two times a day (BID) | ORAL | Status: DC

## 2014-10-20 NOTE — Telephone Encounter (Signed)
I informed pt, her last office visit was 07/2013, and to maintain current health history and manage long-term medication Dr. Paulla Dolly would like to see the pt.  Pt states understanding and is transferred to schedulers.  Refilled Gabapentin 100mg  #90 one tablet bid no refills.

## 2014-10-20 NOTE — Telephone Encounter (Signed)
Pt needs and appt to be monitored for medication.

## 2014-10-24 ENCOUNTER — Encounter: Payer: Self-pay | Admitting: Podiatry

## 2014-10-24 ENCOUNTER — Ambulatory Visit (INDEPENDENT_AMBULATORY_CARE_PROVIDER_SITE_OTHER): Payer: Medicare Other | Admitting: Podiatry

## 2014-10-24 VITALS — BP 105/54 | HR 67 | Resp 16

## 2014-10-24 DIAGNOSIS — M779 Enthesopathy, unspecified: Secondary | ICD-10-CM | POA: Diagnosis not present

## 2014-10-24 DIAGNOSIS — G5761 Lesion of plantar nerve, right lower limb: Secondary | ICD-10-CM | POA: Diagnosis not present

## 2014-10-24 NOTE — Progress Notes (Signed)
Subjective:     Patient ID: Nichole [name], female   DOB: Mar 16, 1950, 64 y.o.   MRN: [id number]  HPI patient states I'm doing much better with minimal discomfort and it seems that the medication by mouth is really helping me and I don't have shooting pain in my right foot   Review of Systems     Objective:   Physical Exam Neurovascular status intact negative Biagio Borg sign currently noted with patient having minimal neuropathic symptoms but pain at times around the fifth metatarsal    Assessment:     Doing well from neuro lysis injections and gabapentin treatment with tailor's bunion deformity moderate nature right    Plan:     Reviewed all conditions and evaluated continued treatment like we have done with wide-type shoe gear. Reappoint to recheck

## 2014-12-07 ENCOUNTER — Other Ambulatory Visit: Payer: Self-pay | Admitting: Podiatry

## 2014-12-08 ENCOUNTER — Encounter: Payer: Self-pay | Admitting: Family Medicine

## 2014-12-08 NOTE — Telephone Encounter (Signed)
Pt must make an appt prior to future refill, per last clinical note pt was to make an appt for recheck.

## 2014-12-09 ENCOUNTER — Encounter: Payer: Self-pay | Admitting: Family Medicine

## 2014-12-09 DIAGNOSIS — Z780 Asymptomatic menopausal state: Secondary | ICD-10-CM | POA: Insufficient documentation

## 2014-12-24 ENCOUNTER — Encounter: Payer: Self-pay | Admitting: Family Medicine

## 2014-12-24 ENCOUNTER — Ambulatory Visit (INDEPENDENT_AMBULATORY_CARE_PROVIDER_SITE_OTHER): Payer: Medicare Other | Admitting: Family Medicine

## 2014-12-24 VITALS — BP 100/76 | HR 80 | Temp 98.5°F | Resp 16 | Ht 60.0 in | Wt 129.0 lb

## 2014-12-24 DIAGNOSIS — Z0001 Encounter for general adult medical examination with abnormal findings: Secondary | ICD-10-CM | POA: Diagnosis not present

## 2014-12-24 DIAGNOSIS — R5383 Other fatigue: Secondary | ICD-10-CM

## 2014-12-24 DIAGNOSIS — Z0000 Encounter for general adult medical examination without abnormal findings: Secondary | ICD-10-CM | POA: Insufficient documentation

## 2014-12-24 DIAGNOSIS — H9313 Tinnitus, bilateral: Secondary | ICD-10-CM | POA: Diagnosis not present

## 2014-12-24 DIAGNOSIS — Z7689 Persons encountering health services in other specified circumstances: Secondary | ICD-10-CM | POA: Insufficient documentation

## 2014-12-24 DIAGNOSIS — E785 Hyperlipidemia, unspecified: Secondary | ICD-10-CM | POA: Diagnosis not present

## 2014-12-24 DIAGNOSIS — Z114 Encounter for screening for human immunodeficiency virus [HIV]: Secondary | ICD-10-CM | POA: Insufficient documentation

## 2014-12-24 DIAGNOSIS — R7301 Impaired fasting glucose: Secondary | ICD-10-CM | POA: Diagnosis not present

## 2014-12-24 DIAGNOSIS — Z1159 Encounter for screening for other viral diseases: Secondary | ICD-10-CM | POA: Insufficient documentation

## 2014-12-24 LAB — COMPREHENSIVE METABOLIC PANEL
ALT: 28 U/L (ref 0–35)
AST: 26 U/L (ref 0–37)
Albumin: 4.6 g/dL (ref 3.5–5.2)
Alkaline Phosphatase: 61 U/L (ref 39–117)
BUN: 14 mg/dL (ref 6–23)
CALCIUM: 9.9 mg/dL (ref 8.4–10.5)
CHLORIDE: 103 meq/L (ref 96–112)
CO2: 27 meq/L (ref 19–32)
CREATININE: 0.89 mg/dL (ref 0.40–1.20)
GFR: 67.33 mL/min (ref >60.00)
Glucose, Bld: 84 mg/dL (ref 70–99)
POTASSIUM: 4.2 meq/L (ref 3.5–5.1)
Sodium: 139 mEq/L (ref 135–145)
Total Bilirubin: 0.8 mg/dL (ref 0.2–1.2)
Total Protein: 7.1 g/dL (ref 6.0–8.3)

## 2014-12-24 LAB — CBC WITH DIFFERENTIAL/PLATELET
Basophils Absolute: 0 10*3/uL (ref 0.0–0.1)
Basophils Relative: 0.6 % (ref 0.0–3.0)
EOS PCT: 1.3 % (ref 0.0–5.0)
Eosinophils Absolute: 0.1 10*3/uL (ref 0.0–0.7)
HCT: 42.5 % (ref 36.0–46.0)
Hemoglobin: 14.1 g/dL (ref 12.0–15.0)
LYMPHS ABS: 1.2 10*3/uL (ref 0.7–4.0)
Lymphocytes Relative: 15.9 % (ref 12.0–46.0)
MCHC: 33 g/dL (ref 30.0–36.0)
MCV: 94.6 fl (ref 78.0–100.0)
MONO ABS: 0.6 10*3/uL (ref 0.1–1.0)
Monocytes Relative: 8.6 % (ref 3.0–12.0)
NEUTROS PCT: 73.6 % (ref 43.0–77.0)
Neutro Abs: 5.5 10*3/uL (ref 1.4–7.7)
Platelets: 289 10*3/uL (ref 150.0–400.0)
RBC: 4.49 Mil/uL (ref 3.87–5.11)
RDW: 13.9 % (ref 11.5–15.5)
WBC: 7.5 10*3/uL (ref 4.0–10.5)

## 2014-12-24 LAB — VITAMIN B12: Vitamin B-12: >1500 pg/mL — ABNORMAL HIGH (ref 211–911)

## 2014-12-24 LAB — LDL CHOLESTEROL, DIRECT: Direct LDL: 109 mg/dL

## 2014-12-24 LAB — TSH: TSH: 1.87 u[IU]/mL (ref 0.35–4.50)

## 2014-12-24 LAB — VITAMIN D 25 HYDROXY (VIT D DEFICIENCY, FRACTURES): VITD: 34.62 ng/mL (ref 30.00–100.00)

## 2014-12-24 LAB — HEMOGLOBIN A1C: HEMOGLOBIN A1C: 5.8 % (ref 4.6–6.5)

## 2014-12-24 NOTE — Progress Notes (Signed)
Pre visit review using our clinic review tool, if applicable. No additional management support is needed unless otherwise documented below in the visit note. 

## 2014-12-24 NOTE — Progress Notes (Addendum)
Patient ID: Nichole [name], female   DOB: 19-Jun-1950, 64 y.o.   MRN: [id number]      Patient ID: Nichole [name], female   DOB: [birth year], 64 y.o.   MRN: [id number]  Subjective:  Patient presents for new patient establishment. All past medical history, surgical history, allergies, family history, immunizations and social history was obtained from the patient today and entered into the electronic medical record. Records are requested from her prior PCP, and will be reviewed at the time they are received. All medical records will be updated at that time.  Tinnitus 3 months: worse in right ear, dizziness. No falls, loosing balance. Tinnitus and dizziness same time. Constant. Buzz.solid. With sharp ring. Feels she hears well, but not as good as prior. Does take a a fair amount of nsaids for back pain. Has raynauds as well. Neck/back arthritis as well.   Health maintenance:  Colonoscopy: UTD 2014, normal, repeat 5 year for personal h/o adenoma. No FHX Mammogram: FHX present Mat. Aunt, pat. Aunt mat. cousin, UTD 2016, normal. Yearly screen. Cervical cancer screening: Hysterectomy, has GYN Immunizations: Tdap UTD 2016, PNA23 UTD 04/2014, PCV13 indicated 04/2015. Flu declined.  Infectious disease screening: Hep c and HIV indicated DEXA: osteopenia , estrogen deficient, 2016 bone density Assistive device: None  Oxygen use: None  Patient has a Dental home. Eye doctor: UTD, wears contacts, follows yearly.  Hospitalizations/ED visits: None Dr. Ubaldo Glassing, dermatology for yearly skin exam, basal cell many years ago, right eyebrow.  Past Medical History  Diagnosis Date  . Capsulitis of foot 05/02/2012    acute capsulitis 2nd mpj bilateral .  . Osteoarthritis   . Raynaud's disease /phenomenon   . IBS (irritable bowel syndrome)   . Depression (emotion)   . Anxiety   . Chronic headaches   . Fatigue   . Rosacea   . Neuropathy ([place])     fingers/feet  . Hyperlipidemia   . Insomnia   . Elevated hemoglobin A1c   .  Basal cell carcinoma   . Colon polyp     tubular adenoma  . Hyponatremia     2012   Allergies  Allergen Reactions  . Codeine     Crawling skin , weird twilight dreams   Past Surgical History  Procedure Laterality Date  . Bunionectomy Bilateral   . Vaginal hysterectomy N/A   . Breast reduction surgery N/A     complications of blood supply  . Digit nail removal Right     fingernail removed 4th  . Facial cosmetic surgery    . Cardiac catheterization      06/2005   Family History  Problem Relation Age of Onset  . Dementia Mother   . Cancer Father   . Hypertension Father   . Fibromyalgia Sister   . Heart attack Maternal Aunt   . Heart attack Maternal Uncle   . Cancer Paternal Aunt   . Breast cancer Paternal Aunt   . Rheum arthritis Paternal Grandmother   . Hypertension Sister   . Breast cancer Cousin   . Heart attack Maternal Uncle    Social History   Social History  . Marital Status: Married    Spouse Name: N/A  . Number of Children: N/A  . Years of Education: N/A   Occupational History  . Not on file.   Social History Main Topics  . Smoking status: Never Smoker   . Smokeless tobacco: Never Used  . Alcohol Use: No  . Drug Use: No  .  Sexual Activity: Yes    Birth Control/ Protection: Post-menopausal     Comment: maybe once a month due to vaginal dryness   Other Topics Concern  . Not on file   Social History Narrative    ROS:  Negative, with the exception of above mentioned in HPI   Objective: Ht 5' (1.524 m)  Wt 129 lb (58.514 kg)  BMI 25.19 kg/m2 Gen: Afebrile. No acute distress. Nontoxic in appearance, well-developed, well-nourished, caucasian female.  HENT: AT. [place]. Bilateral TM visualized and normal in appearance, normal external auditory canal. MMM, no oral lesions, good dentition. Bilateral nares without erythema or swelling. Throat without erythema, ulcerations or exudates. No Cough on exam, no hoarseness on exam. Eyes:Pupils Equal Round  Reactive to light, Extraocular movements intact,  Conjunctiva without redness, discharge or icterus. Neck/lymp/endocrine: Supple, no lymphadenopathy, no thyromegaly CV: RRR no murmur, no edema, +2/4 P posterior tibialis pulses.  Chest: CTAB, no wheeze, rhonchi or crackles.  Abd: Soft. Flat. NTND. BS present. No Masses palpated. No hepatosplenomegaly. No rebound tenderness or guarding. Skin: No rashes, purpura or petechiae. Warm and well-perfused. Skin intact. Neuro/Msk: Normal gait. PERLA. EOMi. Alert. Oriented x3.  Cranial nerves II through XII intact. Muscle strength 5/5 upper and lower extremity.  Psych: Normal affect, dress and demeanor. Normal speech. Normal thought content and judgment. Hearing: Right ear 25 dB 500 Hz, 20 dB 1000-4000 Hz. Left ear 500 Hz - 4000 Hz at 20 dB  Assessment/plan: Nichole [name] is a 64 y.o. female present for establishment of care. Patient was encouraged to exercise greater than 150 minutes a week. Patient was encouraged to choose a diet filled with fresh fruits and vegetables, and lean meats. AVS provided to patient today for education/recommendation on gender specific health and safety maintenance. Colonoscopy: UTD 2014, normal, repeat 5 year for personal h/o adenoma. No FHX Mammogram: FHX present Mat. Aunt, pat. Aunt mat. cousin, UTD 2016, normal. Yearly screen. Cervical cancer screening: Hysterectomy, has GYN Immunizations: Tdap UTD 2016, PNA23 UTD 04/2014, PCV13 indicated 04/2015. Flu declined.  Infectious disease screening: Hep c and HIV ordered DEXA: osteopenia , estrogen deficient, 2016 bone density IUTD Assistive device: None  Oxygen use: None  Patient has a Dental home. Eye doctor: UTD, wears contacts, follows yearly.  Hospitalizations/ED visits: None Dr. Ubaldo Glassing, dermatology for yearly skin exam, basal cell many years ago, right eyebrow. HLD (hyperlipidemia) - Direct LDL, pt  Not fasting.   Elevated fasting blood sugar - HgB A1c  Other fatigue - CBC  w/Diff - Vitamin D (25 hydroxy) - B12 - Comp Met (CMET) - TSH  Tinnitus of both ears - Greater than 3 months in duration. Hearing screen today within normal limits mild difference at 500 Hz. Patient does take a fair amount of NSAIDs. Counseled patient on NSAID use and cause tinnitus. Patient also with mild dizziness. Refer to ENT secondary to chronicity.  - AVS on causes of tinnitus given to patient today. - Ambulatory referral to ENT  Encounter for screening for HIV - Patient amendable to screening for HIV today. - HIV antibody (with reflex)  Need for hepatitis C screening test - Patient amendable to screening for hepatitis C today. - Hepatitis C Antibody  Follow-up 6 months-one year depending upon lab results.  Howard Pouch, DO [place]

## 2014-12-24 NOTE — Patient Instructions (Signed)
Health Maintenance, Female Adopting a healthy lifestyle and getting preventive care can go a long way to promote health and wellness. Talk with your health care provider about what schedule of regular examinations is right for you. This is a good chance for you to check in with your provider about disease prevention and staying healthy. In between checkups, there are plenty of things you can do on your own. Experts have done a lot of research about which lifestyle changes and preventive measures are most likely to keep you healthy. Ask your health care provider for more information. WEIGHT AND DIET  Eat a healthy diet  Be sure to include plenty of vegetables, fruits, low-fat dairy products, and lean protein.  Do not eat a lot of foods high in solid fats, added sugars, or salt.  Get regular exercise. This is one of the most important things you can do for your health.  Most adults should exercise for at least 150 minutes each week. The exercise should increase your heart rate and make you sweat (moderate-intensity exercise).  Most adults should also do strengthening exercises at least twice a week. This is in addition to the moderate-intensity exercise.  Maintain a healthy weight  Body mass index (BMI) is a measurement that can be used to identify possible weight problems. It estimates body fat based on height and weight. Your health care provider can help determine your BMI and help you achieve or maintain a healthy weight.  For females 20 years of age and older:   A BMI below 18.5 is considered underweight.  A BMI of 18.5 to 24.9 is normal.  A BMI of 25 to 29.9 is considered overweight.  A BMI of 30 and above is considered obese.  Watch levels of cholesterol and blood lipids  You should start having your blood tested for lipids and cholesterol at 66 years of age, then have this test every 5 years.  You may need to have your cholesterol levels checked more often if:  Your lipid  or cholesterol levels are high.  You are older than 66 years of age.  You are at high risk for heart disease.  CANCER SCREENING   Lung Cancer  Lung cancer screening is recommended for adults 55-80 years old who are at high risk for lung cancer because of a history of smoking.  A yearly low-dose CT scan of the lungs is recommended for people who:  Currently smoke.  Have quit within the past 15 years.  Have at least a 30-pack-year history of smoking. A pack year is smoking an average of one pack of cigarettes a day for 1 year.  Yearly screening should continue until it has been 15 years since you quit.  Yearly screening should stop if you develop a health problem that would prevent you from having lung cancer treatment.  Breast Cancer  Practice breast self-awareness. This means understanding how your breasts normally appear and feel.  It also means doing regular breast self-exams. Let your health care provider know about any changes, no matter how small.  If you are in your 20s or 30s, you should have a clinical breast exam (CBE) by a health care provider every 1-3 years as part of a regular health exam.  If you are 40 or older, have a CBE every year. Also consider having a breast X-ray (mammogram) every year.  If you have a family history of breast cancer, talk to your health care provider about genetic screening.  If you   are at high risk for breast cancer, talk to your health care provider about having an MRI and a mammogram every year.  Breast cancer gene (BRCA) assessment is recommended for women who have family members with BRCA-related cancers. BRCA-related cancers include:  Breast.  Ovarian.  Tubal.  Peritoneal cancers.  Results of the assessment will determine the need for genetic counseling and BRCA1 and BRCA2 testing. Cervical Cancer Your health care provider may recommend that you be screened regularly for cancer of the pelvic organs (ovaries, uterus, and  vagina). This screening involves a pelvic examination, including checking for microscopic changes to the surface of your cervix (Pap test). You may be encouraged to have this screening done every 3 years, beginning at age 21.  For women ages 30-65, health care providers may recommend pelvic exams and Pap testing every 3 years, or they may recommend the Pap and pelvic exam, combined with testing for human papilloma virus (HPV), every 5 years. Some types of HPV increase your risk of cervical cancer. Testing for HPV may also be done on women of any age with unclear Pap test results.  Other health care providers may not recommend any screening for nonpregnant women who are considered low risk for pelvic cancer and who do not have symptoms. Ask your health care provider if a screening pelvic exam is right for you.  If you have had past treatment for cervical cancer or a condition that could lead to cancer, you need Pap tests and screening for cancer for at least 20 years after your treatment. If Pap tests have been discontinued, your risk factors (such as having a new sexual partner) need to be reassessed to determine if screening should resume. Some women have medical problems that increase the chance of getting cervical cancer. In these cases, your health care provider may recommend more frequent screening and Pap tests. Colorectal Cancer  This type of cancer can be detected and often prevented.  Routine colorectal cancer screening usually begins at 66 years of age and continues through 66 years of age.  Your health care provider may recommend screening at an earlier age if you have risk factors for colon cancer.  Your health care provider may also recommend using home test kits to check for hidden blood in the stool.  A small camera at the end of a tube can be used to examine your colon directly (sigmoidoscopy or colonoscopy). This is done to check for the earliest forms of colorectal  cancer.  Routine screening usually begins at age 50.  Direct examination of the colon should be repeated every 5-10 years through 66 years of age. However, you may need to be screened more often if early forms of precancerous polyps or small growths are found. Skin Cancer  Check your skin from head to toe regularly.  Tell your health care provider about any new moles or changes in moles, especially if there is a change in a mole's shape or color.  Also tell your health care provider if you have a mole that is larger than the size of a pencil eraser.  Always use sunscreen. Apply sunscreen liberally and repeatedly throughout the day.  Protect yourself by wearing long sleeves, pants, a wide-brimmed hat, and sunglasses whenever you are outside. HEART DISEASE, DIABETES, AND HIGH BLOOD PRESSURE   High blood pressure causes heart disease and increases the risk of stroke. High blood pressure is more likely to develop in:  People who have blood pressure in the high end   of the normal range (130-139/85-89 mm Hg).  People who are overweight or obese.  People who are African American.  If you are 38-23 years of age, have your blood pressure checked every 3-5 years. If you are 61 years of age or older, have your blood pressure checked every year. You should have your blood pressure measured twice--once when you are at a hospital or clinic, and once when you are not at a hospital or clinic. Record the average of the two measurements. To check your blood pressure when you are not at a hospital or clinic, you can use:  An automated blood pressure machine at a pharmacy.  A home blood pressure monitor.  If you are between 45 years and 39 years old, ask your health care provider if you should take aspirin to prevent strokes.  Have regular diabetes screenings. This involves taking a blood sample to check your fasting blood sugar level.  If you are at a normal weight and have a low risk for diabetes,  have this test once every three years after 66 years of age.  If you are overweight and have a high risk for diabetes, consider being tested at a younger age or more often. PREVENTING INFECTION  Hepatitis B  If you have a higher risk for hepatitis B, you should be screened for this virus. You are considered at high risk for hepatitis B if:  You were born in a country where hepatitis B is common. Ask your health care provider which countries are considered high risk.  Your parents were born in a high-risk country, and you have not been immunized against hepatitis B (hepatitis B vaccine).  You have HIV or AIDS.  You use needles to inject street drugs.  You live with someone who has hepatitis B.  You have had sex with someone who has hepatitis B.  You get hemodialysis treatment.  You take certain medicines for conditions, including cancer, organ transplantation, and autoimmune conditions. Hepatitis C  Blood testing is recommended for:  Everyone born from [birth year] through 1967.  Anyone with known risk factors for hepatitis C. Sexually transmitted infections (STIs)  You should be screened for sexually transmitted infections (STIs) including gonorrhea and chlamydia if:  You are sexually active and are younger than 66 years of age.  You are older than 66 years of age and your health care provider tells you that you are at risk for this type of infection.  Your sexual activity has changed since you were last screened and you are at an increased risk for chlamydia or gonorrhea. Ask your health care provider if you are at risk.  If you do not have HIV, but are at risk, it may be recommended that you take a prescription medicine daily to prevent HIV infection. This is called pre-exposure prophylaxis (PrEP). You are considered at risk if:  You are sexually active and do not regularly use condoms or know the HIV status of your partner(s).  You take drugs by injection.  You are sexually  active with a partner who has HIV. Talk with your health care provider about whether you are at high risk of being infected with HIV. If you choose to begin PrEP, you should first be tested for HIV. You should then be tested every 3 months for as long as you are taking PrEP.  PREGNANCY   If you are premenopausal and you may become pregnant, ask your health care provider about preconception counseling.  If you may  become pregnant, take 400 to 800 micrograms (mcg) of folic acid every day.  If you want to prevent pregnancy, talk to your health care provider about birth control (contraception). OSTEOPOROSIS AND MENOPAUSE   Osteoporosis is a disease in which the bones lose minerals and strength with aging. This can result in serious bone fractures. Your risk for osteoporosis can be identified using a bone density scan.  If you are 33 years of age or older, or if you are at risk for osteoporosis and fractures, ask your health care provider if you should be screened.  Ask your health care provider whether you should take a calcium or vitamin D supplement to lower your risk for osteoporosis.  Menopause may have certain physical symptoms and risks.  Hormone replacement therapy may reduce some of these symptoms and risks. Talk to your health care provider about whether hormone replacement therapy is right for you.  HOME CARE INSTRUCTIONS   Schedule regular health, dental, and eye exams.  Stay current with your immunizations.   Do not use any tobacco products including cigarettes, chewing tobacco, or electronic cigarettes.  If you are pregnant, do not drink alcohol.  If you are breastfeeding, limit how much and how often you drink alcohol.  Limit alcohol intake to no more than 1 drink per day for nonpregnant women. One drink equals 12 ounces of beer, 5 ounces of wine, or 1 ounces of hard liquor.  Do not use street drugs.  Do not share needles.  Ask your health care provider for help if  you need support or information about quitting drugs.  Tell your health care provider if you often feel depressed.  Tell your health care provider if you have ever been abused or do not feel safe at home.   This information is not intended to replace advice given to you by your health care provider. Make sure you discuss any questions you have with your health care provider.   Document Released: 07/26/2010 Document Revised: 01/31/2014 Document Reviewed: 12/12/2012 Elsevier Interactive Patient Education 2016 [employer].  Take at least 800 iu of vit D daily.  I will place a referral for you to have your tinnitus/hearing evaluated further. This maybe coming from medications, but there are many other possibilities of cause as well.  When you need refills of ambien have your pharmacisit send request.

## 2014-12-25 ENCOUNTER — Encounter: Payer: Self-pay | Admitting: Obstetrics & Gynecology

## 2014-12-25 ENCOUNTER — Telehealth: Payer: Self-pay | Admitting: Family Medicine

## 2014-12-25 DIAGNOSIS — E559 Vitamin D deficiency, unspecified: Secondary | ICD-10-CM

## 2014-12-25 DIAGNOSIS — R7309 Other abnormal glucose: Secondary | ICD-10-CM | POA: Insufficient documentation

## 2014-12-25 LAB — HEPATITIS C ANTIBODY: HCV AB: NEGATIVE

## 2014-12-25 LAB — HIV ANTIBODY (ROUTINE TESTING W REFLEX): HIV: NONREACTIVE

## 2014-12-25 MED ORDER — VITAMIN D (ERGOCALCIFEROL) 1.25 MG (50000 UNIT) PO CAPS: 50000.0000 [IU] | ORAL_CAPSULE | ORAL | Status: DC

## 2014-12-25 NOTE — Telephone Encounter (Signed)
LMOM for pt to CB.  

## 2014-12-25 NOTE — Telephone Encounter (Signed)
Patient aware of results.  Pt states her A1C was higher than that at a previous reading so she will continue to work on it.  Pt also aware of vit d results and medication at pharmacy.  Pt had no questions at this time.

## 2014-12-25 NOTE — Telephone Encounter (Signed)
Please call pt:  - a1c 5.8, this is mildly elevated. This should be monitored in 6 months since this is considered a "prediabetic". Patient should be encouraged to exercise for 30 minutes 5 days a week, and to use healthy diet option with your carbohydrate and sugar content, more fresh fruits and vegetables and lean meats. - Her vitamin D is low normal at 34.6, she should be taking at least 800 iu daily vitamin D. - I have called in a higher prescribed weekly dose, that I would like her to take for 12 weeks to boost her vitamin D level, and she can then take the recommended 800 over-the-counter vitamin D daily.

## 2014-12-29 ENCOUNTER — Encounter: Payer: Self-pay | Admitting: Family Medicine

## 2014-12-29 NOTE — Telephone Encounter (Signed)
I am uncertain why they have not released to her. They automatically release after a few days. If they are not there within a week of resulting, she is welcome to either pick up a copy of her labs or we can mail her a copy.

## 2015-01-01 ENCOUNTER — Encounter: Payer: Self-pay | Admitting: Family Medicine

## 2015-01-13 NOTE — Addendum Note (Signed)
Addended by: Howard Pouch A on: 01/13/2015 11:58 AM   Modules accepted: Level of Service

## 2015-01-20 ENCOUNTER — Other Ambulatory Visit: Payer: Self-pay | Admitting: Podiatry

## 2015-01-27 DIAGNOSIS — H52203 Unspecified astigmatism, bilateral: Secondary | ICD-10-CM | POA: Diagnosis not present

## 2015-01-27 DIAGNOSIS — H5213 Myopia, bilateral: Secondary | ICD-10-CM | POA: Diagnosis not present

## 2015-02-02 ENCOUNTER — Telehealth: Payer: Self-pay | Admitting: Family Medicine

## 2015-02-02 NOTE — Telephone Encounter (Signed)
Patient needs Rx for zolpidem sent to [facility]. She states it is going to require a precert

## 2015-02-03 ENCOUNTER — Encounter: Payer: Self-pay | Admitting: Family Medicine

## 2015-02-03 ENCOUNTER — Other Ambulatory Visit: Payer: Self-pay | Admitting: Family Medicine

## 2015-02-03 DIAGNOSIS — M5416 Radiculopathy, lumbar region: Secondary | ICD-10-CM | POA: Insufficient documentation

## 2015-02-03 DIAGNOSIS — M47812 Spondylosis without myelopathy or radiculopathy, cervical region: Secondary | ICD-10-CM | POA: Insufficient documentation

## 2015-02-03 MED ORDER — ZOLPIDEM TARTRATE 10 MG PO TABS: ORAL_TABLET | ORAL | Status: DC

## 2015-02-03 NOTE — Telephone Encounter (Signed)
Patient requesting Lorrin Mais refill last refill 08/08/14 with 5 additional refills. This was ordered as 1/2 tab at [facility] but dispensing amount is 30 tabs. Last OV was 12/24/14

## 2015-02-03 NOTE — Telephone Encounter (Signed)
Refill request printed and faxed for Ambien.

## 2015-02-09 ENCOUNTER — Other Ambulatory Visit: Payer: Self-pay | Admitting: Family Medicine

## 2015-02-09 MED ORDER — ROSUVASTATIN CALCIUM 10 MG PO TABS: 10.0000 mg | ORAL_TABLET | Freq: Every day | ORAL | Status: DC

## 2015-03-27 ENCOUNTER — Encounter: Payer: Self-pay | Admitting: Family Medicine

## 2015-03-27 ENCOUNTER — Ambulatory Visit (INDEPENDENT_AMBULATORY_CARE_PROVIDER_SITE_OTHER): Payer: PPO | Admitting: Family Medicine

## 2015-03-27 ENCOUNTER — Other Ambulatory Visit: Payer: Medicare Other

## 2015-03-27 VITALS — BP 99/65 | HR 81 | Temp 98.1°F | Resp 20 | Wt 128.5 lb

## 2015-03-27 DIAGNOSIS — R51 Headache: Secondary | ICD-10-CM

## 2015-03-27 DIAGNOSIS — H6981 Other specified disorders of Eustachian tube, right ear: Secondary | ICD-10-CM | POA: Diagnosis not present

## 2015-03-27 DIAGNOSIS — E559 Vitamin D deficiency, unspecified: Secondary | ICD-10-CM | POA: Diagnosis not present

## 2015-03-27 DIAGNOSIS — R519 Headache, unspecified: Secondary | ICD-10-CM | POA: Insufficient documentation

## 2015-03-27 MED ORDER — FLUTICASONE PROPIONATE 50 MCG/ACT NA SUSP: 2.0000 | Freq: Every day | NASAL | Status: DC

## 2015-03-27 MED ORDER — BACLOFEN 20 MG PO TABS: 20.0000 mg | ORAL_TABLET | Freq: Three times a day (TID) | ORAL | Status: DC

## 2015-03-27 NOTE — Progress Notes (Signed)
Patient ID: Nichole [name], female   DOB: 24-Apr-1950, 65 y.o.   MRN: [id number]    Nichole [name] , 09/07/1950, 65 y.o., female MRN: [id number]  CC: headache Subjective: Pt presents for an acute OV with complaints of headache,worsening to daily headaches since the beginning of the year after a bad cold. Associated symptoms include congestion, dark circles under eyes, mild dizziness. Takes 10 to be near eyes, temporal area to anterior ears. She states headache is mildly present all day. She does not wake up with a headache. She states they are not as bad "when sitting ". He denies any nausea, vomit, visual changes, photophobia or weakness. She denies any aphasia. He has no difficulty swallowing. She does admit to having her glasses changed approximately one month ago. She states she got a new prescription. She does not feel like her glasses are too tight on her head. She feels like the prescription is better and she sees more clearly. He does admit to wearing glasses more than normal. She states she can take up to a half of Allegra today, but she has difficulty taking antihistamine secondary to dry eyes.   Allergies  Allergen Reactions  . Codeine     Crawling skin , weird twilight dreams  . Tramadol     Itching    Social History  Substance Use Topics  . Smoking status: Never Smoker   . Smokeless tobacco: Never Used  . Alcohol Use: No   Past Medical History  Diagnosis Date  . Capsulitis of foot 05/02/2012    acute capsulitis 2nd mpj bilateral .  . Osteoarthritis   . Raynaud's disease /phenomenon   . IBS (irritable bowel syndrome)   . Depression (emotion)   . Anxiety   . Chronic headaches   . Fatigue   . Rosacea   . Neuropathy ([place])     fingers/feet  . Hyperlipidemia   . Insomnia   . Elevated hemoglobin A1c   . Colon polyp     tubular adenoma  . Hyponatremia     2012  . Asthma   . Basal cell carcinoma   . Osteopenia    Past Surgical History  Procedure Laterality Date  . Bunionectomy  Bilateral   . Vaginal hysterectomy N/A   . Breast reduction surgery N/A     complications of blood supply  . Digit nail removal Right     fingernail removed 4th  . Facial cosmetic surgery    . Cardiac catheterization      06/2005   Family History  Problem Relation Age of Onset  . Dementia Mother   . Cancer Father   . Hypertension Father   . Fibromyalgia Sister   . Heart attack Maternal Aunt   . Heart attack Maternal Uncle   . Cancer Paternal Aunt   . Breast cancer Paternal Aunt   . Rheum arthritis Paternal Grandmother   . Hypertension Sister   . Breast cancer Cousin   . Heart attack Maternal Uncle      Medication List       This list is accurate as of: 03/27/15  3:47 PM.  Always use your most recent med list.               amLODipine 2.5 MG tablet  Commonly known as:  NORVASC  Take 1 tablet (2.5 mg total) by mouth daily.     aspirin 81 MG tablet  Take 81 mg by mouth daily.     buPROPion 150  MG 12 hr tablet  Commonly known as:  WELLBUTRIN SR  Take 1 tablet (150 mg total) by mouth 2 (two) times daily.     CALCIUM 1200 PO  Take 600 mg by mouth.     Cinnamon 500 MG capsule  Take 500 mg by mouth daily. Reported on 03/27/2015     FINACEA 15 % cream  Generic drug:  Azelaic Acid     gabapentin 100 MG capsule  Commonly known as:  NEURONTIN  TAKE 1 CAPSULE (100 MG TOTAL) BY MOUTH 2 (TWO) TIMES DAILY.     HYDROmorphone 2 MG tablet  Commonly known as:  DILAUDID     lidocaine 5 %  Commonly known as:  LIDODERM  Place 1 patch onto the skin.     loratadine 10 MG tablet  Commonly known as:  CLARITIN  Take 10 mg by mouth daily. Pt breaks tablet into fourths and only takes one fourth.     MIRALAX PO  Take by mouth.     niacin 100 MG tablet  Take 100 mg by mouth at bedtime.     Omega 3 1000 MG Caps  Take by mouth.     PROBIOTIC ADVANCED PO  Take by mouth.     rosuvastatin 10 MG tablet  Commonly known as:  CRESTOR  Take 1 tablet (10 mg total) by mouth daily.       zolpidem 10 MG tablet  Commonly known as:  AMBIEN  1/2 tab nightly prn        ROS: Negative, with the exception of above mentioned in HPI Objective:  BP 99/65 mmHg  Pulse 81  Temp(Src) 98.1 F (36.7 C) (Oral)  Resp 20  Wt 128 lb 8 oz (58.287 kg)  SpO2 97% Body mass index is 25.1 kg/(m^2). Gen: Afebrile. No acute distress. Nontoxic in appearance, well-developed, well-nourished, Caucasian female. HENT: AT. [place]. Bilateral TM visualized, left tympanic membrane normal. Right tympanic membrane air-fluid level/bulging.  MMM, no oral lesions. Bilateral nares with no erythema or bogginess. Throat without erythema or exudates. No cough on exam. No hoarseness on exam. No tenderness to palpation facial sinuses. Eyes:Pupils Equal Round Reactive to light, Extraocular movements intact,  Conjunctiva without redness, discharge or icterus. Neck/lymp/endocrine: Supple, no lymphadenopathy, no thyromegaly CV: RRR  Chest: CTAB, no wheeze or crackles. Good air movement, normal resp effort.  Skin: No rashes, purpura or petechiae.  Neuro:  Normal gait. PERLA. EOMi. Alert. Oriented x3 Cranial nerves II through XII intact.  Psych: Normal affect, dress and demeanor. Normal speech. Normal thought content and judgment..   Assessment/Plan: TAHJAE SKLAR is a 65 y.o. female present for acute OV for  Frequent headaches/right eustachian tube dysfunction - Patient with repeated right tympanic membrane air-fluid levels. Discussed with her today the possible etiologies of daily/frequent headaches that do not seem to be migraine in nature. Patient decided to attempt to treat as eustachian tube dysfunction/allergies. She has been intolerant to oral antihistamines secondary to dry eyes, will try Flonase. - From patient's history could be tension headache related. Also considered her new glasses as being a possible cause. She is to monitor the effect of the glasses on her head to make sure it's not squeezing too tightly. Her  headaches seem to be centered over forehead/eyes to ears only. - Baclofen trial. - fluticasone (FLONASE) 50 MCG/ACT nasal spray; Place 2 sprays into both nostrils daily.  Dispense: 16 g; Refill: 2 - baclofen (LIORESAL) 20 MG tablet; Take 1 tablet (20 mg total)  by mouth 3 (three) times daily.  Dispense: 30 each; Refill: 0 - Discussed other potential causes of headaches including rebound headaches. Patient was provided AVS information on tension headaches and rebound headaches. - Reviewed workup for headaches if the above does not work would include lab collection. she has recent B-12 of greater than 1500, CBC was unremarkable, normal TSH, negative HIV. Vitamin D collected today.  - would consider neuro referral if not able to break cycle.  electronically signed by:  Howard Pouch, DO  [place]

## 2015-03-27 NOTE — Patient Instructions (Signed)
Analgesic Rebound Headaches An analgesic rebound headache is a headache that returns after pain medicine (analgesic) that was taken to treat the initial headache wears off. People who suffer from tension, migraine, or cluster headaches are at risk for developing rebound headaches. Any type of primary headache can return as a rebound headache if you regularly take analgesics more than three times a week. If the cycle of rebound headaches continues, they become chronic daily headaches.  CAUSES Analgesics frequently associated with this problem include common over-the-counter medicines like aspirin, ibuprofen, acetaminophen, sinus relief medicines, and other medicines that contain caffeine. Narcotic pain medicines are also a common cause of rebound headaches.  SIGNS AND SYMPTOMS The symptoms of rebound headaches are the same as the symptoms of your initial headache. Symptoms of specific types of headaches include: Tension headache  Pressure around the head.  Dull, aching head pain.  Pain felt over the front and sides of the head.  Tenderness in the muscles of the head, neck and shoulders. Migraine Headache  Pulsing or throbbing pain on one or both sides of the head.  Severe pain that interferes with daily activities.  Pain that is worsened by physical activity.  Nausea, vomiting, or both.  Pain with exposure to bright light, loud noises, or strong smells.  General sensitivity to bright light, loud noises, or strong smells.  Visual changes.  Numbness of one or both arms. Cluster Headaches  Severe pain that begins in or around one eye or temple.  Redness in the eye on the same side as the pain.  Droopy or swollen eyelid.  One-sided head pain.  Nausea.  Runny nose.  Sweaty, pale facial skin.  Restlessness. DIAGNOSIS  Analgesic rebound headaches are diagnosed by reviewing your medical history. This includes the nature of your initial headaches, as well as the type of pain  medicines you have been using to treat your headaches and how often you take them. TREATMENT Discontinuing frequent use of the analgesic medicine will typically reduce the frequency of the rebound episodes. This may initially worsen your headaches but eventually the pain should become more manageable, less frequent, and less severe.  Seeing a headache specialists may helpful. He or she may be able to help you manage your headaches and to make sure there is not another cause of the headaches. Alternative methods of stress relief such as acupuncture, counseling, biofeedback, and massage may also be helpful. Talk with your health care provider about which alternative treatments might be good for you. HOME CARE INSTRUCTIONS Stopping the regular use of pain medicine can be difficult. Follow your health care provider's instructions carefully. Keep all of your appointments. Avoid triggers that are known to cause your primary headaches. SEEK MEDICAL CARE IF: You continue to experience headaches after following your health care provider's recommended treatments. SEEK IMMEDIATE MEDICAL CARE IF:  You develop new headache pain.  You develop headache pain that is different than what you have experienced in the past.  You develop numbness or tingling in your arms or legs.  You develop changes in your speech or vision. MAKE SURE YOU:  Understand these instructions.  Will watch your child's condition.  Will get help right away if your child is not doing well or gets worse.   This information is not intended to replace advice given to you by your health care provider. Make sure you discuss any questions you have with your health care provider.   Document Released: 04/02/2003 Document Revised: 01/31/2014 Document Reviewed: 07/26/2012 Elsevier   Interactive Patient Education 2016 Elsevier Inc.  Tension Headache A tension headache is a feeling of pain, pressure, or aching that is often felt over the front  and sides of the head. The pain can be dull, or it can feel tight (constricting). Tension headaches are not normally associated with nausea or vomiting, and they do not get worse with physical activity. Tension headaches can last from 30 minutes to several days. This is the most common type of headache. CAUSES The exact cause of this condition is not known. Tension headaches often begin after stress, anxiety, or depression. Other triggers may include:  Alcohol.  Too much caffeine, or caffeine withdrawal.  Respiratory infections, such as colds, flu, or sinus infections.  Dental problems or teeth clenching.  Fatigue.  Holding your head and neck in the same position for a long period of time, such as while using a computer.  Smoking. SYMPTOMS Symptoms of this condition include:  A feeling of pressure around the head.  Dull, aching head pain.  Pain felt over the front and sides of the head.  Tenderness in the muscles of the head, neck, and shoulders. DIAGNOSIS This condition may be diagnosed based on your symptoms and a physical exam. Tests may be done, such as a CT scan or an MRI of your head. These tests may be done if your symptoms are severe or unusual. TREATMENT This condition may be treated with lifestyle changes and medicines to help relieve symptoms. HOME CARE INSTRUCTIONS Managing Pain  Take over-the-counter and prescription medicines only as told by your health care provider.  Lie down in a dark, quiet room when you have a headache.  If directed, apply ice to the head and neck area:  Put ice in a plastic bag.  Place a towel between your skin and the bag.  Leave the ice on for 20 minutes, 2-3 times per day.  Use a heating pad or a hot shower to apply heat to the head and neck area as told by your health care provider. Eating and Drinking  Eat meals on a regular schedule.  Limit alcohol use.  Decrease your caffeine intake, or stop using caffeine. General  Instructions  Keep all follow-up visits as told by your health care provider. This is important.  Keep a headache journal to help find out what may trigger your headaches. For example, write down:  What you eat and drink.  How much sleep you get.  Any change to your diet or medicines.  Try massage or other relaxation techniques.  Limit stress.  Sit up straight, and avoid tensing your muscles.  Do not use tobacco products, including cigarettes, chewing tobacco, or e-cigarettes. If you need help quitting, ask your health care provider.  Exercise regularly as told by your health care provider.  Get 7-9 hours of sleep, or the amount recommended by your health care provider. SEEK MEDICAL CARE IF:  Your symptoms are not helped by medicine.  You have a headache that is different from what you normally experience.  You have nausea or you vomit.  You have a fever. SEEK IMMEDIATE MEDICAL CARE IF:  Your headache becomes severe.  You have repeated vomiting.  You have a stiff neck.  You have a loss of vision.  You have problems with speech.  You have pain in your eye or ear.  You have muscular weakness or loss of muscle control.  You lose your balance or you have trouble walking.  You feel faint or you  pass out.  You have confusion.   This information is not intended to replace advice given to you by your health care provider. Make sure you discuss any questions you have with your health care provider.   Document Released: 01/10/2005 Document Revised: 10/01/2014 Document Reviewed: 05/05/2014 Elsevier Interactive Patient Education [employer].

## 2015-03-28 LAB — VITAMIN D 25 HYDROXY (VIT D DEFICIENCY, FRACTURES): Vit D, 25-Hydroxy: 63 ng/mL (ref 30–100)

## 2015-03-30 ENCOUNTER — Telehealth: Payer: Self-pay | Admitting: Family Medicine

## 2015-03-30 NOTE — Telephone Encounter (Signed)
Please call pt: - her vitamin D is excellent (63). -She should continue [phone] daily to maintain level.

## 2015-03-30 NOTE — Telephone Encounter (Signed)
Left message on patient voice mail with lab results and instructions per [facility]

## 2015-04-03 ENCOUNTER — Telehealth: Payer: Self-pay | Admitting: Family Medicine

## 2015-04-03 NOTE — Telephone Encounter (Signed)
Pt calling in stating that she has received a bill for $231.51 from [place] 11/2014 for discussing headaches. Pt states that was never talked about durnning that visit and wants someone to address this. Pt states that she had [employer] at that time and now has [employer].

## 2015-04-06 NOTE — Telephone Encounter (Signed)
Patient referred to billing department to clarify charges.

## 2015-07-06 DIAGNOSIS — Z803 Family history of malignant neoplasm of breast: Secondary | ICD-10-CM | POA: Diagnosis not present

## 2015-07-06 DIAGNOSIS — Z1231 Encounter for screening mammogram for malignant neoplasm of breast: Secondary | ICD-10-CM | POA: Diagnosis not present

## 2015-07-22 DIAGNOSIS — Z419 Encounter for procedure for purposes other than remedying health state, unspecified: Secondary | ICD-10-CM | POA: Diagnosis not present

## 2015-07-22 DIAGNOSIS — D2272 Melanocytic nevi of left lower limb, including hip: Secondary | ICD-10-CM | POA: Diagnosis not present

## 2015-07-22 DIAGNOSIS — I788 Other diseases of capillaries: Secondary | ICD-10-CM | POA: Diagnosis not present

## 2015-07-22 DIAGNOSIS — D225 Melanocytic nevi of trunk: Secondary | ICD-10-CM | POA: Diagnosis not present

## 2015-07-22 DIAGNOSIS — D2271 Melanocytic nevi of right lower limb, including hip: Secondary | ICD-10-CM | POA: Diagnosis not present

## 2015-07-22 DIAGNOSIS — Z85828 Personal history of other malignant neoplasm of skin: Secondary | ICD-10-CM | POA: Diagnosis not present

## 2015-07-22 DIAGNOSIS — D1801 Hemangioma of skin and subcutaneous tissue: Secondary | ICD-10-CM | POA: Diagnosis not present

## 2015-07-22 DIAGNOSIS — L570 Actinic keratosis: Secondary | ICD-10-CM | POA: Diagnosis not present

## 2015-07-22 DIAGNOSIS — L814 Other melanin hyperpigmentation: Secondary | ICD-10-CM | POA: Diagnosis not present

## 2015-07-22 DIAGNOSIS — L820 Inflamed seborrheic keratosis: Secondary | ICD-10-CM | POA: Diagnosis not present

## 2015-07-22 DIAGNOSIS — D2239 Melanocytic nevi of other parts of face: Secondary | ICD-10-CM | POA: Diagnosis not present

## 2015-07-22 DIAGNOSIS — L821 Other seborrheic keratosis: Secondary | ICD-10-CM | POA: Diagnosis not present

## 2015-08-05 DIAGNOSIS — G894 Chronic pain syndrome: Secondary | ICD-10-CM | POA: Diagnosis not present

## 2015-08-05 DIAGNOSIS — M50823 Other cervical disc disorders at C6-C7 level: Secondary | ICD-10-CM | POA: Diagnosis not present

## 2015-08-05 DIAGNOSIS — M5416 Radiculopathy, lumbar region: Secondary | ICD-10-CM | POA: Diagnosis not present

## 2015-08-05 DIAGNOSIS — M50822 Other cervical disc disorders at C5-C6 level: Secondary | ICD-10-CM | POA: Diagnosis not present

## 2015-08-18 ENCOUNTER — Other Ambulatory Visit: Payer: Self-pay | Admitting: Obstetrics & Gynecology

## 2015-08-18 NOTE — Telephone Encounter (Signed)
Medication refill request: buPROPion 150mg  Last AEX:  08/08/14 SM Next AEX: 11/06/15 Last MMG (if hormonal medication request): 07/06/15 BI-RADS 2 benign Refill authorized: 08/08/14 #180 w/4 refills; today #180 w/1 refills? Please advise

## 2015-08-21 ENCOUNTER — Other Ambulatory Visit: Payer: Self-pay | Admitting: *Deleted

## 2015-08-21 MED ORDER — ZOLPIDEM TARTRATE 10 MG PO TABS: ORAL_TABLET | ORAL | 3 refills | Status: DC

## 2015-08-21 NOTE — Telephone Encounter (Signed)
Received a pharmacy request for [facility]. Patient last OV 03/27/15 last refill 02/03/15 30 tabs with 5 refills.

## 2015-08-22 ENCOUNTER — Other Ambulatory Visit: Payer: Self-pay | Admitting: Obstetrics & Gynecology

## 2015-08-24 NOTE — Telephone Encounter (Signed)
Medication refill request: Amlodipine Last AEX:  08/08/14 SM Next AEX: 11/06/15 SM Last MMG (if hormonal medication request): 07/06/15 BIRADS2 Refill authorized: 08/08/14 #90 4R. Please advise. Thank you

## 2015-08-24 NOTE — Telephone Encounter (Signed)
Please have pt ask pharmacy to send this request to her PCP.  I do not write this Rx for her.  Thanks.  Rx was declined.

## 2015-08-24 NOTE — Telephone Encounter (Signed)
Spoke to [employer] at pharmacy. He is going to contact patient and advise her that she will need to contact her PCP to have this rx filled.

## 2015-09-03 ENCOUNTER — Telehealth: Payer: Self-pay | Admitting: Family Medicine

## 2015-09-03 MED ORDER — AMLODIPINE BESYLATE 2.5 MG PO TABS: 2.5000 mg | ORAL_TABLET | Freq: Every day | ORAL | 0 refills | Status: DC

## 2015-09-03 NOTE — Telephone Encounter (Signed)
1 refill sent for amlodipine patient needs appt prior to anymore refills.

## 2015-09-03 NOTE — Telephone Encounter (Signed)
Amlodipine [facility]

## 2015-09-15 DIAGNOSIS — M5416 Radiculopathy, lumbar region: Secondary | ICD-10-CM | POA: Diagnosis not present

## 2015-09-15 DIAGNOSIS — M50822 Other cervical disc disorders at C5-C6 level: Secondary | ICD-10-CM | POA: Diagnosis not present

## 2015-10-14 ENCOUNTER — Encounter: Payer: Self-pay | Admitting: Podiatry

## 2015-10-14 ENCOUNTER — Ambulatory Visit (INDEPENDENT_AMBULATORY_CARE_PROVIDER_SITE_OTHER): Payer: PPO

## 2015-10-14 ENCOUNTER — Ambulatory Visit (INDEPENDENT_AMBULATORY_CARE_PROVIDER_SITE_OTHER): Payer: PPO | Admitting: Podiatry

## 2015-10-14 DIAGNOSIS — S93409A Sprain of unspecified ligament of unspecified ankle, initial encounter: Secondary | ICD-10-CM

## 2015-10-14 DIAGNOSIS — M25572 Pain in left ankle and joints of left foot: Secondary | ICD-10-CM

## 2015-10-14 DIAGNOSIS — S93401A Sprain of unspecified ligament of right ankle, initial encounter: Secondary | ICD-10-CM

## 2015-10-14 DIAGNOSIS — R600 Localized edema: Secondary | ICD-10-CM | POA: Diagnosis not present

## 2015-10-14 NOTE — Progress Notes (Signed)
Subjective:     Patient ID: Nichole [name], female   DOB: 06-Apr-1950, 65 y.o.   MRN: [id number]  HPI patient states she injured her left ankle several weeks ago and is still swollen and feels unstable   Review of Systems     Objective:   Physical Exam Neurovascular status intact muscle strength adequate with excessive inversion left with edema in the lateral side left ankle and discoloration consistent with bruising. It is localized    Assessment:     Cannot rule out fracture left ankle with inflammatory changes    Plan:     Reviewed x-rays H&P and advised on compression with a dressing that was dispensed. I went ahead today and advised on Tri-Lock ankle brace which was dispensed to the patient

## 2015-10-20 ENCOUNTER — Other Ambulatory Visit: Payer: Self-pay | Admitting: *Deleted

## 2015-10-20 MED ORDER — ROSUVASTATIN CALCIUM 10 MG PO TABS: 10.0000 mg | ORAL_TABLET | Freq: Every day | ORAL | 0 refills | Status: DC

## 2015-10-22 ENCOUNTER — Telehealth: Payer: Self-pay | Admitting: *Deleted

## 2015-10-22 NOTE — Telephone Encounter (Signed)
Refill request for crestor received from [facility]. This rx was sent to [facility] on 10/20/15 as most recent pharmacy listed when patient requested refill . Sent message to Vladimir Faster if patient wants it transferred the can call [facility]. Patient needs office visit prior to anymore refills.

## 2015-10-27 ENCOUNTER — Ambulatory Visit (INDEPENDENT_AMBULATORY_CARE_PROVIDER_SITE_OTHER): Payer: PPO | Admitting: Obstetrics & Gynecology

## 2015-10-27 ENCOUNTER — Encounter: Payer: Self-pay | Admitting: Obstetrics & Gynecology

## 2015-10-27 VITALS — BP 102/64 | HR 62 | Resp 12 | Ht 59.5 in | Wt 124.0 lb

## 2015-10-27 DIAGNOSIS — Z01419 Encounter for gynecological examination (general) (routine) without abnormal findings: Secondary | ICD-10-CM

## 2015-10-27 NOTE — Patient Instructions (Addendum)
[facility] [address] Johnette Abraham [place], [place] [address]  Phone:  [phone]  Ovid Curd needs one more Gardisil.

## 2015-10-27 NOTE — Progress Notes (Signed)
65 y.o. G2P0202 Married Caucasian F here for annual exam.  Doing well.  No vaginal bleeding.  PCP:  Dr. Raoul Pitch.  [place].    No LMP recorded. Patient has had a hysterectomy.          Sexually active: Yes.    The current method of family planning is status post hysterectomy.    Exercising: No.  The patient does not participate in regular exercise at present. Smoker:  no  Health Maintenance: Pap:  2005 negative  History of abnormal Pap:  no MMG:  07/06/15 BIRADS 2 benign Colonoscopy:  01/03/13 normal repeat 5 years BMD:   05/14/14 at [facility]:  05/01/14  Pneumonia vaccine(s):  05/01/14  Zostavax:   6 years ago  Hep C testing: has had done Screening Labs: PCP, Hb today: PCP, Urine today: PCP   reports that she has never smoked. She has never used smokeless tobacco. She reports that she does not drink alcohol or use drugs.  Past Medical History:  Diagnosis Date  . Anxiety   . Asthma   . Basal cell carcinoma   . Capsulitis of foot 05/02/2012   acute capsulitis 2nd mpj bilateral .  . Chronic headaches   . Colon polyp    tubular adenoma  . Depression (emotion)   . Elevated hemoglobin A1c   . Fatigue   . Hyperlipidemia   . Hyponatremia    2012  . IBS (irritable bowel syndrome)   . Insomnia   . Neuropathy ([place])    fingers/feet  . Osteoarthritis   . Osteopenia   . Raynaud's disease /phenomenon   . Rosacea     Past Surgical History:  Procedure Laterality Date  . BREAST REDUCTION SURGERY N/A    complications of blood supply  . BUNIONECTOMY Bilateral   . CARDIAC CATHETERIZATION     06/2005  . DIGIT NAIL REMOVAL Right    fingernail removed 4th  . FACIAL COSMETIC SURGERY    . VAGINAL HYSTERECTOMY N/A     Current Outpatient Prescriptions  Medication Sig Dispense Refill  . amLODipine (NORVASC) 2.5 MG tablet Take 1 tablet (2.5 mg total) by mouth daily. 90 tablet 0  . aspirin 81 MG tablet Take 81 mg by mouth daily.    . baclofen (LIORESAL) 20 MG  tablet Take 1 tablet (20 mg total) by mouth 3 (three) times daily. 30 each 0  . buPROPion (WELLBUTRIN SR) 150 MG 12 hr tablet TAKE 1 TABLET (150 MG TOTAL) BY MOUTH 2 (TWO) TIMES DAILY. 180 tablet 1  . Calcium Carbonate-Vit D-Min (CALCIUM 1200 PO) Take 600 mg by mouth.    . Cinnamon 500 MG capsule Take 500 mg by mouth daily. Reported on 03/27/2015    . FINACEA 15 % cream     . fluticasone (FLONASE) 50 MCG/ACT nasal spray Place 2 sprays into both nostrils daily. 16 g 2  . gabapentin (NEURONTIN) 100 MG capsule TAKE 1 CAPSULE (100 MG TOTAL) BY MOUTH 2 (TWO) TIMES DAILY. 90 capsule 11  . HYDROmorphone (DILAUDID) 2 MG tablet   0  . lidocaine (LIDODERM) 5 % Place 1 patch onto the skin.    Marland Kitchen loratadine (CLARITIN) 10 MG tablet Take 10 mg by mouth daily. Pt breaks tablet into fourths and only takes one fourth.    . niacin 100 MG tablet Take 100 mg by mouth at bedtime.    . Omega 3 1000 MG CAPS Take by mouth.    . Polyethylene Glycol 3350 (MIRALAX  PO) Take by mouth.    . Probiotic Product (PROBIOTIC ADVANCED PO) Take by mouth.    . rosuvastatin (CRESTOR) 10 MG tablet Take 1 tablet (10 mg total) by mouth daily. 30 tablet 0  . zolpidem (AMBIEN) 10 MG tablet 1/2 tab nightly prn 30 tablet 3   No current facility-administered medications for this visit.     Family History  Problem Relation Age of Onset  . Dementia Mother   . Cancer Father   . Hypertension Father   . Fibromyalgia Sister   . Heart attack Maternal Aunt   . Heart attack Maternal Uncle   . Cancer Paternal Aunt   . Breast cancer Paternal Aunt   . Hypertension Sister   . Breast cancer Cousin   . Heart attack Maternal Uncle   . Rheum arthritis Paternal Grandmother     ROS:  Pertinent items are noted in HPI.  Otherwise, a comprehensive ROS was negative.  Exam:   Vitals:   10/27/15 1046  BP: 102/64  Pulse: 62  Resp: 12    General appearance: alert, cooperative and appears stated age Head: Normocephalic, without obvious  abnormality, atraumatic Neck: no adenopathy, supple, symmetrical, trachea midline and thyroid normal to inspection and palpation Lungs: clear to auscultation bilaterally Breasts: normal appearance, no masses or tenderness, no nipples present Heart: regular rate and rhythm Abdomen: soft, non-tender; bowel sounds normal; no masses,  no organomegaly Extremities: extremities normal, atraumatic, no cyanosis or edema Skin: Skin color, texture, turgor normal. No rashes or lesions Lymph nodes: Cervical, supraclavicular, and axillary nodes normal. No abnormal inguinal nodes palpated Neurologic: Grossly normal  Pelvic: External genitalia:  no lesions              Urethra:  normal appearing urethra with no masses, tenderness or lesions              Bartholins and Skenes: normal                 Vagina: normal appearing vagina with normal color and discharge, no lesions              Cervix: no lesions              Pap taken: No. Bimanual Exam:  Uterus:  uterus absent              Adnexa: no mass, fullness, tenderness               Rectovaginal: Confirms               Anus:  normal sphincter tone, no lesions  Chaperone was present for exam.  A:    Well Woman with normal exam PMP, no HRT H/O breast reduction with complications, now with no nipples or areolas bilaterally Bilateral feet neuropathy Depression/anxiety S/P TVH, ovaries remain, completed for bleeding issues  P: Mammogram discussed. Recommend 3D due to breast scarring. pap smear no indicated Labs/routine care/vaccines with Dr. Raoul Pitch return annually or prn

## 2015-10-28 DIAGNOSIS — M50823 Other cervical disc disorders at C6-C7 level: Secondary | ICD-10-CM | POA: Diagnosis not present

## 2015-10-28 DIAGNOSIS — M5416 Radiculopathy, lumbar region: Secondary | ICD-10-CM | POA: Diagnosis not present

## 2015-10-28 DIAGNOSIS — M47812 Spondylosis without myelopathy or radiculopathy, cervical region: Secondary | ICD-10-CM | POA: Diagnosis not present

## 2015-10-28 DIAGNOSIS — M50822 Other cervical disc disorders at C5-C6 level: Secondary | ICD-10-CM | POA: Diagnosis not present

## 2015-10-30 ENCOUNTER — Other Ambulatory Visit: Payer: Self-pay | Admitting: *Deleted

## 2015-10-30 MED ORDER — ROSUVASTATIN CALCIUM 10 MG PO TABS: 10.0000 mg | ORAL_TABLET | Freq: Every day | ORAL | 0 refills | Status: DC

## 2015-11-06 ENCOUNTER — Ambulatory Visit: Payer: Medicare Other | Admitting: Obstetrics & Gynecology

## 2015-11-25 DIAGNOSIS — M5416 Radiculopathy, lumbar region: Secondary | ICD-10-CM | POA: Diagnosis not present

## 2015-11-30 ENCOUNTER — Encounter: Payer: Self-pay | Admitting: *Deleted

## 2015-11-30 ENCOUNTER — Other Ambulatory Visit: Payer: Self-pay | Admitting: *Deleted

## 2015-11-30 MED ORDER — AMLODIPINE BESYLATE 2.5 MG PO TABS: 2.5000 mg | ORAL_TABLET | Freq: Every day | ORAL | 0 refills | Status: DC

## 2015-11-30 NOTE — Telephone Encounter (Signed)
Refilled amlodipine for 30 day supply. Patient needs appt prior to anymore refills. Left message and sent message on my chart for patient to call to schedule appt.

## 2015-12-02 ENCOUNTER — Other Ambulatory Visit: Payer: Self-pay | Admitting: Family Medicine

## 2015-12-02 MED ORDER — AMLODIPINE BESYLATE 2.5 MG PO TABS: 2.5000 mg | ORAL_TABLET | Freq: Every day | ORAL | 0 refills | Status: DC

## 2015-12-02 NOTE — Progress Notes (Signed)
Refilled requested med.

## 2015-12-15 ENCOUNTER — Other Ambulatory Visit: Payer: Self-pay | Admitting: *Deleted

## 2015-12-15 MED ORDER — AMLODIPINE BESYLATE 2.5 MG PO TABS: 2.5000 mg | ORAL_TABLET | Freq: Every day | ORAL | 3 refills | Status: DC

## 2015-12-15 MED ORDER — ROSUVASTATIN CALCIUM 10 MG PO TABS: 10.0000 mg | ORAL_TABLET | Freq: Every day | ORAL | 2 refills | Status: DC

## 2015-12-15 NOTE — Telephone Encounter (Signed)
Refilled amlodipine and crestor.

## 2015-12-31 ENCOUNTER — Encounter: Payer: Medicare Other | Admitting: Family Medicine

## 2015-12-31 ENCOUNTER — Ambulatory Visit (INDEPENDENT_AMBULATORY_CARE_PROVIDER_SITE_OTHER): Payer: PPO | Admitting: Family Medicine

## 2015-12-31 ENCOUNTER — Encounter: Payer: Self-pay | Admitting: Family Medicine

## 2015-12-31 VITALS — BP 107/67 | HR 59 | Temp 97.7°F | Resp 20 | Ht 60.0 in | Wt 122.5 lb

## 2015-12-31 DIAGNOSIS — R7309 Other abnormal glucose: Secondary | ICD-10-CM | POA: Diagnosis not present

## 2015-12-31 DIAGNOSIS — Z130 Encounter for screening for diseases of the blood and blood-forming organs and certain disorders involving the immune mechanism: Secondary | ICD-10-CM | POA: Diagnosis not present

## 2015-12-31 DIAGNOSIS — Z79899 Other long term (current) drug therapy: Secondary | ICD-10-CM | POA: Diagnosis not present

## 2015-12-31 DIAGNOSIS — E785 Hyperlipidemia, unspecified: Secondary | ICD-10-CM | POA: Diagnosis not present

## 2015-12-31 DIAGNOSIS — Z0000 Encounter for general adult medical examination without abnormal findings: Secondary | ICD-10-CM

## 2015-12-31 DIAGNOSIS — Z23 Encounter for immunization: Secondary | ICD-10-CM | POA: Diagnosis not present

## 2015-12-31 DIAGNOSIS — E559 Vitamin D deficiency, unspecified: Secondary | ICD-10-CM | POA: Diagnosis not present

## 2015-12-31 DIAGNOSIS — Z1329 Encounter for screening for other suspected endocrine disorder: Secondary | ICD-10-CM

## 2015-12-31 DIAGNOSIS — R3989 Other symptoms and signs involving the genitourinary system: Secondary | ICD-10-CM | POA: Diagnosis not present

## 2015-12-31 LAB — CBC WITH DIFFERENTIAL/PLATELET
BASOS PCT: 1.2 % (ref 0.0–3.0)
Basophils Absolute: 0.1 10*3/uL (ref 0.0–0.1)
EOS ABS: 0.1 10*3/uL (ref 0.0–0.7)
Eosinophils Relative: 1.3 % (ref 0.0–5.0)
HCT: 39.5 % (ref 36.0–46.0)
Hemoglobin: 13.4 g/dL (ref 12.0–15.0)
Lymphocytes Relative: 36.3 % (ref 12.0–46.0)
Lymphs Abs: 1.9 10*3/uL (ref 0.7–4.0)
MCHC: 33.9 g/dL (ref 30.0–36.0)
MCV: 94.8 fl (ref 78.0–100.0)
MONO ABS: 0.5 10*3/uL (ref 0.1–1.0)
Monocytes Relative: 9.7 % (ref 3.0–12.0)
NEUTROS ABS: 2.7 10*3/uL (ref 1.4–7.7)
Neutrophils Relative %: 51.5 % (ref 43.0–77.0)
PLATELETS: 268 10*3/uL (ref 150.0–400.0)
RBC: 4.17 Mil/uL (ref 3.87–5.11)
RDW: 13.7 % (ref 11.5–15.5)
WBC: 5.3 10*3/uL (ref 4.0–10.5)

## 2015-12-31 LAB — URINALYSIS, ROUTINE W REFLEX MICROSCOPIC
Bilirubin Urine: NEGATIVE
Hgb urine dipstick: NEGATIVE
Ketones, ur: NEGATIVE
Leukocytes, UA: NEGATIVE
Nitrite: NEGATIVE
RBC / HPF: NONE SEEN (ref 0–?)
TOTAL PROTEIN, URINE-UPE24: NEGATIVE
URINE GLUCOSE: NEGATIVE
Urobilinogen, UA: 0.2 (ref 0.0–1.0)
WBC UA: NONE SEEN (ref 0–?)
pH: 7 (ref 5.0–8.0)

## 2015-12-31 LAB — LIPID PANEL
Cholesterol: 203 mg/dL — ABNORMAL HIGH (ref 0–200)
HDL: 76.4 mg/dL (ref >39.00)
LDL Cholesterol: 95 mg/dL (ref 0–99)
NONHDL: 126.99
TRIGLYCERIDES: 159 mg/dL — AB (ref 0.0–149.0)
Total CHOL/HDL Ratio: 3
VLDL: 31.8 mg/dL (ref 0.0–40.0)

## 2015-12-31 LAB — VITAMIN D 25 HYDROXY (VIT D DEFICIENCY, FRACTURES): VITD: 53.26 ng/mL (ref 30.00–100.00)

## 2015-12-31 LAB — COMPLETE METABOLIC PANEL WITH GFR
ALBUMIN: 4.5 g/dL (ref 3.6–5.1)
ALK PHOS: 56 U/L (ref 33–130)
ALT: 18 U/L (ref 6–29)
AST: 22 U/L (ref 10–35)
BILIRUBIN TOTAL: 0.6 mg/dL (ref 0.2–1.2)
BUN: 9 mg/dL (ref 7–25)
CALCIUM: 9.9 mg/dL (ref 8.6–10.4)
CHLORIDE: 100 mmol/L (ref 98–110)
CO2: 30 mmol/L (ref 20–31)
CREATININE: 0.81 mg/dL (ref 0.50–0.99)
GFR, EST AFRICAN AMERICAN: 87 mL/min (ref >=60)
GFR, Est Non African American: 75 mL/min (ref >=60)
Glucose, Bld: 83 mg/dL (ref 65–99)
Potassium: 4.5 mmol/L (ref 3.5–5.3)
Sodium: 137 mmol/L (ref 135–146)
Total Protein: 6.7 g/dL (ref 6.1–8.1)

## 2015-12-31 LAB — TSH: TSH: 1.68 u[IU]/mL (ref 0.35–4.50)

## 2015-12-31 LAB — HEMOGLOBIN A1C: HEMOGLOBIN A1C: 5.7 % (ref 4.6–6.5)

## 2015-12-31 NOTE — Patient Instructions (Signed)
Health Maintenance, Female Introduction Adopting a healthy lifestyle and getting preventive care can go a long way to promote health and wellness. Talk with your health care provider about what schedule of regular examinations is right for you. This is a good chance for you to check in with your provider about disease prevention and staying healthy. In between checkups, there are plenty of things you can do on your own. Experts have done a lot of research about which lifestyle changes and preventive measures are most likely to keep you healthy. Ask your health care provider for more information. Weight and diet Eat a healthy diet  Be sure to include plenty of vegetables, fruits, low-fat dairy products, and lean protein.  Do not eat a lot of foods high in solid fats, added sugars, or salt.  Get regular exercise. This is one of the most important things you can do for your health.  Most adults should exercise for at least 150 minutes each week. The exercise should increase your heart rate and make you sweat (moderate-intensity exercise).  Most adults should also do strengthening exercises at least twice a week. This is in addition to the moderate-intensity exercise. Maintain a healthy weight  Body mass index (BMI) is a measurement that can be used to identify possible weight problems. It estimates body fat based on height and weight. Your health care provider can help determine your BMI and help you achieve or maintain a healthy weight.  For females 63 years of age and older:  A BMI below 18.5 is considered underweight.  A BMI of 18.5 to 24.9 is normal.  A BMI of 25 to 29.9 is considered overweight.  A BMI of 30 and above is considered obese. Watch levels of cholesterol and blood lipids  You should start having your blood tested for lipids and cholesterol at 67 years of age, then have this test every 5 years.  You may need to have your cholesterol levels checked more often if:  Your  lipid or cholesterol levels are high.  You are older than 67 years of age.  You are at high risk for heart disease. Cancer screening Lung Cancer  Lung cancer screening is recommended for adults 56-22 years old who are at high risk for lung cancer because of a history of smoking.  A yearly low-dose CT scan of the lungs is recommended for people who:  Currently smoke.  Have quit within the past 15 years.  Have at least a 30-pack-year history of smoking. A pack year is smoking an average of one pack of cigarettes a day for 1 year.  Yearly screening should continue until it has been 15 years since you quit.  Yearly screening should stop if you develop a health problem that would prevent you from having lung cancer treatment. Breast Cancer  Practice breast self-awareness. This means understanding how your breasts normally appear and feel.  It also means doing regular breast self-exams. Let your health care provider know about any changes, no matter how small.  If you are in your 20s or 30s, you should have a clinical breast exam (CBE) by a health care provider every 1-3 years as part of a regular health exam.  If you are 35 or older, have a CBE every year. Also consider having a breast X-ray (mammogram) every year.  If you have a family history of breast cancer, talk to your health care provider about genetic screening.  If you are at high risk for breast cancer,  talk to your health care provider about having an MRI and a mammogram every year.  Breast cancer gene (BRCA) assessment is recommended for women who have family members with BRCA-related cancers. BRCA-related cancers include:  Breast.  Ovarian.  Tubal.  Peritoneal cancers.  Results of the assessment will determine the need for genetic counseling and BRCA1 and BRCA2 testing. Cervical Cancer  Your health care provider may recommend that you be screened regularly for cancer of the pelvic organs (ovaries, uterus, and  vagina). This screening involves a pelvic examination, including checking for microscopic changes to the surface of your cervix (Pap test). You may be encouraged to have this screening done every 3 years, beginning at age 21.  For women ages 30-65, health care providers may recommend pelvic exams and Pap testing every 3 years, or they may recommend the Pap and pelvic exam, combined with testing for human papilloma virus (HPV), every 5 years. Some types of HPV increase your risk of cervical cancer. Testing for HPV may also be done on women of any age with unclear Pap test results.  Other health care providers may not recommend any screening for nonpregnant women who are considered low risk for pelvic cancer and who do not have symptoms. Ask your health care provider if a screening pelvic exam is right for you.  If you have had past treatment for cervical cancer or a condition that could lead to cancer, you need Pap tests and screening for cancer for at least 20 years after your treatment. If Pap tests have been discontinued, your risk factors (such as having a new sexual partner) need to be reassessed to determine if screening should resume. Some women have medical problems that increase the chance of getting cervical cancer. In these cases, your health care provider may recommend more frequent screening and Pap tests. Colorectal Cancer  This type of cancer can be detected and often prevented.  Routine colorectal cancer screening usually begins at 67 years of age and continues through 67 years of age.  Your health care provider may recommend screening at an earlier age if you have risk factors for colon cancer.  Your health care provider may also recommend using home test kits to check for hidden blood in the stool.  A small camera at the end of a tube can be used to examine your colon directly (sigmoidoscopy or colonoscopy). This is done to check for the earliest forms of colorectal  cancer.  Routine screening usually begins at age 50.  Direct examination of the colon should be repeated every 5-10 years through 67 years of age. However, you may need to be screened more often if early forms of precancerous polyps or small growths are found. Skin Cancer  Check your skin from head to toe regularly.  Tell your health care provider about any new moles or changes in moles, especially if there is a change in a mole's shape or color.  Also tell your health care provider if you have a mole that is larger than the size of a pencil eraser.  Always use sunscreen. Apply sunscreen liberally and repeatedly throughout the day.  Protect yourself by wearing long sleeves, pants, a wide-brimmed hat, and sunglasses whenever you are outside. Heart disease, diabetes, and high blood pressure  High blood pressure causes heart disease and increases the risk of stroke. High blood pressure is more likely to develop in:  People who have blood pressure in the high end of the normal range (130-139/85-89 mm Hg).    People who are overweight or obese.  People who are African American.  If you are 18-39 years of age, have your blood pressure checked every 3-5 years. If you are 40 years of age or older, have your blood pressure checked every year. You should have your blood pressure measured twice-once when you are at a hospital or clinic, and once when you are not at a hospital or clinic. Record the average of the two measurements. To check your blood pressure when you are not at a hospital or clinic, you can use:  An automated blood pressure machine at a pharmacy.  A home blood pressure monitor.  If you are between 55 years and 79 years old, ask your health care provider if you should take aspirin to prevent strokes.  Have regular diabetes screenings. This involves taking a blood sample to check your fasting blood sugar level.  If you are at a normal weight and have a low risk for diabetes,  have this test once every three years after 67 years of age.  If you are overweight and have a high risk for diabetes, consider being tested at a younger age or more often. Preventing infection Hepatitis B  If you have a higher risk for hepatitis B, you should be screened for this virus. You are considered at high risk for hepatitis B if:  You were born in a country where hepatitis B is common. Ask your health care provider which countries are considered high risk.  Your parents were born in a high-risk country, and you have not been immunized against hepatitis B (hepatitis B vaccine).  You have HIV or AIDS.  You use needles to inject street drugs.  You live with someone who has hepatitis B.  You have had sex with someone who has hepatitis B.  You get hemodialysis treatment.  You take certain medicines for conditions, including cancer, organ transplantation, and autoimmune conditions. Hepatitis C  Blood testing is recommended for:  Everyone born from 1945 through 1965.  Anyone with known risk factors for hepatitis C. Sexually transmitted infections (STIs)  You should be screened for sexually transmitted infections (STIs) including gonorrhea and chlamydia if:  You are sexually active and are younger than 67 years of age.  You are older than 67 years of age and your health care provider tells you that you are at risk for this type of infection.  Your sexual activity has changed since you were last screened and you are at an increased risk for chlamydia or gonorrhea. Ask your health care provider if you are at risk.  If you do not have HIV, but are at risk, it may be recommended that you take a prescription medicine daily to prevent HIV infection. This is called pre-exposure prophylaxis (PrEP). You are considered at risk if:  You are sexually active and do not regularly use condoms or know the HIV status of your partner(s).  You take drugs by injection.  You are sexually  active with a partner who has HIV. Talk with your health care provider about whether you are at high risk of being infected with HIV. If you choose to begin PrEP, you should first be tested for HIV. You should then be tested every 3 months for as long as you are taking PrEP. Pregnancy  If you are premenopausal and you may become pregnant, ask your health care provider about preconception counseling.  If you may become pregnant, take 400 to 800 micrograms (mcg) of folic acid   every day.  If you want to prevent pregnancy, talk to your health care provider about birth control (contraception). Osteoporosis and menopause  Osteoporosis is a disease in which the bones lose minerals and strength with aging. This can result in serious bone fractures. Your risk for osteoporosis can be identified using a bone density scan.  If you are 41 years of age or older, or if you are at risk for osteoporosis and fractures, ask your health care provider if you should be screened.  Ask your health care provider whether you should take a calcium or vitamin D supplement to lower your risk for osteoporosis.  Menopause may have certain physical symptoms and risks.  Hormone replacement therapy may reduce some of these symptoms and risks. Talk to your health care provider about whether hormone replacement therapy is right for you. Follow these instructions at home:  Schedule regular health, dental, and eye exams.  Stay current with your immunizations.  Do not use any tobacco products including cigarettes, chewing tobacco, or electronic cigarettes.  If you are pregnant, do not drink alcohol.  If you are breastfeeding, limit how much and how often you drink alcohol.  Limit alcohol intake to no more than 1 drink per day for nonpregnant women. One drink equals 12 ounces of beer, 5 ounces of wine, or 1 ounces of hard liquor.  Do not use street drugs.  Do not share needles.  Ask your health care provider for  help if you need support or information about quitting drugs.  Tell your health care provider if you often feel depressed.  Tell your health care provider if you have ever been abused or do not feel safe at home. This information is not intended to replace advice given to you by your health care provider. Make sure you discuss any questions you have with your health care provider. Document Released: 07/26/2010 Document Revised: 06/18/2015 Document Reviewed: 10/14/2014  2017 [employer]  Basic instructions:  Prescription refills and request:  -In order to allow more efficient response time, please call your pharmacy for all refills. They will forward the request electronically to [place]. This allows for the quickest possible response. Request left on a nurse line can take longer to refill, since these are checked as time allows between office patients and other phone calls.  - refill request can take up to 3-5 working days to complete.  - If request is sent electronically and request is appropiate, it is usually completed in 1-2 business days.  - all patients will need to be seen routinely for all chronic medical conditions requiring prescription medications (see follow-up below). If you are overdue for follow up on your condition, you will be asked to make an appointment and we will call in enough medication to cover you until your appointment (up to 30 days).  - all controlled substances will require a face to face visit to request/refill.   Results: If any images or labs were ordered, it can take up to 1 week to get results depending on the test ordered and the lab/facility running and resulting the test. - Normal or stable results, which do not need further discussion, will be released to your mychart immediately with attached note to you. A call will not be generated for normal results. Please make certain to sign up for mychart. If you have questions on how to activate your mychart you can call the  front office.  - If your results need further discussion, our office will attempt  to contact you via phone, and if unable to reach you after 2 attempts, we will release your abnormal result to your mychart with instructions.  - All results will be automatically released in mychart after 1 week.  - Your provider will provide you with explanation and instruction on all relevant material in your results. Please keep in mind, results and labs may appear confusing or abnormal to the untrained eye, but it does not mean they are actually abnormal for you personally. If you have any questions about your results that are not covered, or you desire more detailed explanation than what was provided, you should make an appointment with your provider to do so.   Our office handles many outgoing and incoming calls daily. If we have not contacted you within 1 week about your results, please check your mychart to see if there is a message first and if not, then contact our office.  In helping with this matter, you help decrease call volume, and therefore allow [place] to be able to respond to patients needs more efficiently.   Follow up visits:  Depending on your condition(s) your provider will need to see you routinely in order to provide you with quality care and prescribe medication(s). Most chronic conditions (Example: hypertension, Diabetes, depression/anxiety... etc), require visits a couple times a year. Your provider will instruct you on proper follow up for your personal medical conditions and history. Please make certain to make follow up appointments for your condition as instructed. Failing to do so could result in lapse in your medication treatment/refills. If you request a refill, and are overdue to be seen on a condition, we will always provide you with a 30 day script (once) to allow you time to schedule.   Acute office visits (sick visit):  An acute visit is intended for a new problem and are scheduled in  shorter time slots to allow schedule openings for patients with new problems. This is the appropriate visit to discuss new problems. In order to provide you with excellent quality medical care with proper time for you to explain your problem, have an exam and receive treatment with instructions, these appointments should be limited to one new problem per visit. If you experience a new problem, in which you desire to be addressed, please make an acute office visit, we save openings on the schedule to accommodate you. Please do not save your new problem for any other type of visit, let us take care of it properly and quickly for you.   Medicare wellness (well visit): - we have a wonderful Nurse Maudie Mercury), that will meet with you and provide you will yearly medicare wellness visits. These visits should occur yearly (can not be scheduled less than 1 calendar year apart) and cover preventive health, immunizations, advance directives and screenings you are entitled to yearly through your medicare benefits. Do not miss out on your entitled benefits, this is when medicare will pay for this benefits to be ordered for you.  These are strongly encouraged by your provider and is the appropriate type of visit to make certain you are up to date with all preventive health benefits. If you have not had your medicare wellness exam in the last 12 months, please make certain to schedule one by calling the office and schedule your medicare wellness with Maudie Mercury as soon as possible.   Yearly physical (well visit):  - Adults are recommended to be seen yearly for physicals. Check with your insurance and date  of your last physical, most insurances require one calendar year between physicals. Physicals include all preventive health topics, screenings, medical exam and labs that are appropriate for gender/age and history. You may have fasting labs needed at this visit. This is a well visit (not a sick visit), acute topics should not be covered  during this visit.  - Pediatric patients are seen more frequently when they are younger. Your provider will advise you on well child visit timing that is appropriate for your their age. - This is not a medicare wellness visit. Medicare wellness exams do not have an exam portion to the visit. Some medicare companies allow for a physical, some do not allow a yearly physical. If your medicare allows a yearly physical you can schedule the medicare wellness with our nurse Maudie Mercury and have your physical with your provider after, on the same day. Please check with insurance for your full benefits.

## 2015-12-31 NOTE — Progress Notes (Signed)
Patient ID: Nichole [name], female  DOB: Jul 21, 1950, 65 y.o.   MRN: [id number] Patient Care Team    Relationship Specialty Notifications Start End  Ma Hillock, DO PCP - General Family Medicine  12/24/14   Suella Broad, MD Consulting Physician Physical Medicine and Rehabilitation  12/31/15    Comment: pain mgmt  Megan Salon, MD Consulting Physician Gynecology  12/31/15   Richmond Campbell, MD Consulting Physician Gastroenterology  12/31/15     Subjective:  Nichole [name] is a 65 y.o.  Female  present for CPE. All past medical history, surgical history, allergies, family history, immunizations, medications and social history were updated in the electronic medical record today. All recent labs, ED visits and hospitalizations within the last year were reviewed.  Pt has no complaints today.   Health maintenance:  Colonoscopy: completed 01/03/2013, 5 year recall, prior h/o polyps, Dr. Earlean Shawl Mammogram: completed:06/2015, birads 2, benign, completed through GYN.  Cervical cancer screening: last pap: 2005, results: neg, completed by: Dr. Sabra Heck (GYN) follows with yearly.  Immunizations: tdap 05/01/2014, Influenza declined (encouraged yearly), PNA series completed, zostavax completed Infectious disease screening: Hep C completed DEXA: last completed 07/2014 Assistive device: none Oxygen [id number] Patient has a Dental home. Hospitalizations/ED visits: None   Immunization History  Administered Date(s) Administered  . Pneumococcal Conjugate-13 12/31/2015  . Pneumococcal Polysaccharide-23 05/01/2014  . Tdap 05/01/2014     Past Medical History:  Diagnosis Date  . Anxiety   . Asthma   . Basal cell carcinoma   . Capsulitis of foot 05/02/2012   acute capsulitis 2nd mpj bilateral .  . Chronic headaches   . Colon polyp    tubular adenoma  . Depression (emotion)   . Elevated hemoglobin A1c   . Fatigue   . Hyperlipidemia   . Hyponatremia    2012  . IBS (irritable bowel syndrome)   .  Insomnia   . Neuropathy ([place])    fingers/feet  . Osteoarthritis   . Osteopenia   . Raynaud's disease /phenomenon   . Rosacea    Allergies  Allergen Reactions  . Codeine     Crawling skin , weird twilight dreams  . Tramadol     Itching    Past Surgical History:  Procedure Laterality Date  . BREAST REDUCTION SURGERY N/A    complications of blood supply  . BUNIONECTOMY Bilateral   . CARDIAC CATHETERIZATION     06/2005  . DENTAL SURGERY    . DIGIT NAIL REMOVAL Right    fingernail removed 4th  . FACIAL COSMETIC SURGERY    . VAGINAL HYSTERECTOMY N/A    Family History  Problem Relation Age of Onset  . Dementia Mother   . Cancer Father   . Hypertension Father   . Fibromyalgia Sister   . Heart attack Maternal Aunt   . Heart attack Maternal Uncle   . Cancer Paternal Aunt   . Breast cancer Paternal Aunt   . Hypertension Sister   . Breast cancer Cousin   . Heart attack Maternal Uncle   . Rheum arthritis Paternal Grandmother    Social History   Social History  . Marital status: Married    Spouse name: N/A  . Number of children: N/A  . Years of education: N/A   Occupational History  . Not on file.   Social History Main Topics  . Smoking status: Never Smoker  . Smokeless tobacco: Never Used  . Alcohol use No  . Drug use: No  .  Sexual activity: Yes    Birth control/ protection: Post-menopausal     Comment: maybe once a month due to vaginal dryness   Other Topics Concern  . Not on file   Social History Narrative  . No narrative on file     Medication List       Accurate as of 12/31/15 10:20 AM. Always use your most recent med list.          amLODipine 2.5 MG tablet Commonly known as:  NORVASC Take 1 tablet (2.5 mg total) by mouth daily. (For Raynauds )   aspirin 81 MG tablet Take 81 mg by mouth daily.   buPROPion 150 MG 12 hr tablet Commonly known as:  WELLBUTRIN SR TAKE 1 TABLET (150 MG TOTAL) BY MOUTH 2 (TWO) TIMES DAILY.   CALCIUM 1200  PO Take 600 mg by mouth.   Cinnamon 500 MG capsule Take 500 mg by mouth daily. Reported on 03/27/2015   FINACEA 15 % cream Generic drug:  Azelaic Acid   fluticasone 50 MCG/ACT nasal spray Commonly known as:  FLONASE Place 2 sprays into both nostrils daily.   gabapentin 100 MG capsule Commonly known as:  NEURONTIN TAKE 1 CAPSULE (100 MG TOTAL) BY MOUTH 2 (TWO) TIMES DAILY.   HYDROmorphone 2 MG tablet Commonly known as:  DILAUDID   lidocaine 5 % Commonly known as:  LIDODERM Place 1 patch onto the skin.   loratadine 10 MG tablet Commonly known as:  CLARITIN Take 10 mg by mouth daily. Pt breaks tablet into fourths and only takes one fourth.   methocarbamol 750 MG tablet Commonly known as:  ROBAXIN Take 750 mg by mouth every 6 (six) hours as needed for muscle spasms.   MIRALAX PO Take by mouth.   niacin 100 MG tablet Take 100 mg by mouth at bedtime.   NON FORMULARY Nerve Renew   Omega 3 1000 MG Caps Take by mouth.   PROBIOTIC ADVANCED PO Take by mouth.   rosuvastatin 10 MG tablet Commonly known as:  CRESTOR Take 1 tablet (10 mg total) by mouth daily.   VITAMIN E PO Take by mouth.   zolpidem 10 MG tablet Commonly known as:  AMBIEN 1/2 tab nightly prn        No results found for this or any previous visit (from the past 2160 hour(s)).  Dg Chest 2 View  Result Date: 06/04/2009 Clinical Data: Cough and congestion.  CHEST - 2 VIEW  Comparison: 06/22/2005  Findings: Trachea is midline.  Heart size normal.  Lungs are clear. No pleural fluid.  IMPRESSION: No acute findings. Provider: Pearla Dubonnet    ROS: 14 pt review of systems performed and negative (unless mentioned in an HPI)  Objective: BP 107/67 (BP Location: Right Arm, Patient Position: Sitting, Cuff Size: Normal)   Pulse (!) 59   Temp 97.7 F (36.5 C)   Resp 20   Ht 5' (1.524 m)   Wt 122 lb 8 oz (55.6 kg)   SpO2 99%   BMI 23.92 kg/m  Gen: Afebrile. No acute distress. Nontoxic in appearance,  well-developed, well-nourished,  Pleasant caucasian female.  HENT: AT. [place]. Bilateral TM visualized and normal in appearance, normal external auditory canal. MMM, no oral lesions, adequate dentition. Bilateral nares within normal limits. Throat without erythema, ulcerations or exudates. no Cough on exam, no hoarseness on exam. Eyes:Pupils Equal Round Reactive to light, Extraocular movements intact,  Conjunctiva without redness, discharge or icterus. Neck/lymp/endocrine: Supple,no lymphadenopathy, no thyromegaly CV: RRR no  murmur, noedema, +2/4 P posterior tibialis pulses. no carotid bruitsno. No JVD. Chest: CTAB, no wheeze, rhonchi or crackles. normal Respiratory effort. good Air movement. Abd: Soft. flat. NTND. BS present. no Masses palpated. No hepatosplenomegaly. No rebound tenderness or guarding. Skin: no rashes, purpura or petechiae. Warm and well-perfused. Skin intact. Neuro/Msk:  Normal gait. PERLA. EOMi. Alert. Oriented x3.  Cranial nerves II through XII intact. Muscle strength 5/5 upper/lower extremity. DTRs equal bilaterally. Psych: Normal affect, dress and demeanor. Normal speech. Normal thought content and judgment.  Assessment/plan: Nichole [name] is a 65 y.o. female present for CPE. Encounter for preventive health examination Patient was encouraged to exercise greater than 150 minutes a week. Patient was encouraged to choose a diet filled with fresh fruits and vegetables, and lean meats. AVS provided to patient today for education/recommendation on gender specific health and safety maintenance. Colonoscopy: completed 01/03/2013, 5 year recall, prior h/o polyps, Dr. Earlean Shawl Mammogram: completed:06/2015, birads 2, benign, completed through GYN.  Cervical cancer screening: last pap: 2005, results: neg, completed by: Dr. Sabra Heck (GYN) follows with yearly.  Immunizations: tdap 05/01/2014, Influenza declined (encouraged yearly), PNA series completed, zostavax completed Infectious disease screening:  Hep C completed DEXA: last completed 07/2014 Need for vaccination with 13-polyvalent pneumococcal conjugate vaccine - Pneumococcal conjugate vaccine 13-valent Elevated hemoglobin A1c - HgB A1c Hyperlipidemia, unspecified hyperlipidemia type On statin therapy/Encounter for long-term (current) use of high-risk medication - Lipid panel - CMP Vitamin D deficiency - Vitamin D (25 hydroxy) Screening for deficiency anemia - CBC w/Diff Thyroid disorder screen - TSH Sensation of pressure in bladder area - mild stress incontinence. - Urinalysis, Routine w reflex microscopic   Return in about 1 year (around 12/30/2016) for CPE.  Electronically signed by: Howard Pouch, DO [place]

## 2016-01-08 ENCOUNTER — Encounter: Payer: Self-pay | Admitting: Family Medicine

## 2016-01-08 ENCOUNTER — Other Ambulatory Visit: Payer: Self-pay | Admitting: *Deleted

## 2016-01-08 MED ORDER — AMLODIPINE BESYLATE 2.5 MG PO TABS: 2.5000 mg | ORAL_TABLET | Freq: Every day | ORAL | 3 refills | Status: DC

## 2016-01-28 ENCOUNTER — Other Ambulatory Visit: Payer: Self-pay | Admitting: Podiatry

## 2016-02-04 DIAGNOSIS — H524 Presbyopia: Secondary | ICD-10-CM | POA: Diagnosis not present

## 2016-02-04 DIAGNOSIS — H52203 Unspecified astigmatism, bilateral: Secondary | ICD-10-CM | POA: Diagnosis not present

## 2016-02-16 ENCOUNTER — Telehealth: Payer: Self-pay | Admitting: Family Medicine

## 2016-02-16 NOTE — Telephone Encounter (Signed)
Larene Beach, pharmacist with [facility] on [address], calling to report she is unable for fill rx for zolpidem (AMBIEN) 10 MG tablet due to the system reporting Dr. Lucita Lora DEA & NPI are expired.

## 2016-02-16 NOTE — Telephone Encounter (Signed)
Error on pharmacy side . Nichole [name] has addressed this issue.

## 2016-04-14 DIAGNOSIS — M50822 Other cervical disc disorders at C5-C6 level: Secondary | ICD-10-CM | POA: Diagnosis not present

## 2016-04-14 DIAGNOSIS — M5416 Radiculopathy, lumbar region: Secondary | ICD-10-CM | POA: Diagnosis not present

## 2016-04-14 DIAGNOSIS — M50823 Other cervical disc disorders at C6-C7 level: Secondary | ICD-10-CM | POA: Diagnosis not present

## 2016-04-14 DIAGNOSIS — G894 Chronic pain syndrome: Secondary | ICD-10-CM | POA: Diagnosis not present

## 2016-04-15 ENCOUNTER — Other Ambulatory Visit: Payer: Self-pay | Admitting: *Deleted

## 2016-04-15 ENCOUNTER — Telehealth: Payer: Self-pay | Admitting: Family Medicine

## 2016-04-15 MED ORDER — AMLODIPINE BESYLATE 2.5 MG PO TABS
2.5000 mg | ORAL_TABLET | Freq: Every day | ORAL | 3 refills | Status: DC
Start: 2016-04-15 — End: 2016-04-15

## 2016-04-15 MED ORDER — AMLODIPINE BESYLATE 2.5 MG PO TABS
2.5000 mg | ORAL_TABLET | Freq: Every day | ORAL | 3 refills | Status: DC
Start: 2016-04-15 — End: 2016-10-02

## 2016-04-15 MED ORDER — ZOLPIDEM TARTRATE 10 MG PO TABS: ORAL_TABLET | ORAL | 3 refills | Status: DC

## 2016-04-15 NOTE — Telephone Encounter (Signed)
Patient needs refill on rx:  zolpidem (AMBIEN) 10 MG tablet  [place], [place] - [place] [phone] (Phone) [phone] (Fax)   Also, the last renewal for rx: amLODipine (NORVASC) 2.5 MG tablet patient is not sure if she needs an appointment before getting it refilled. Please call patient to advise. If she does not need to come in please send to:  [id number] - [place], [place] [phone] (Phone) [phone] (Fax)   Please call patient with any questions and once both rx are placed.

## 2016-04-15 NOTE — Telephone Encounter (Signed)
Sent request to Dr Anitra Lauth since Dr Raoul Pitch is out of the office.

## 2016-04-15 NOTE — Telephone Encounter (Signed)
Patient requesting refills on medications . Dr Raoul Pitch out of the office will forward request to Dr Anitra Lauth for approval.

## 2016-04-18 ENCOUNTER — Other Ambulatory Visit: Payer: Self-pay | Admitting: *Deleted

## 2016-04-18 MED ORDER — ROSUVASTATIN CALCIUM 10 MG PO TABS: 10.0000 mg | ORAL_TABLET | Freq: Every day | ORAL | 1 refills | Status: DC

## 2016-05-18 DIAGNOSIS — M47812 Spondylosis without myelopathy or radiculopathy, cervical region: Secondary | ICD-10-CM | POA: Diagnosis not present

## 2016-05-26 ENCOUNTER — Encounter: Payer: Self-pay | Admitting: Family Medicine

## 2016-05-31 DIAGNOSIS — M5416 Radiculopathy, lumbar region: Secondary | ICD-10-CM | POA: Diagnosis not present

## 2016-07-13 ENCOUNTER — Other Ambulatory Visit: Payer: Self-pay | Admitting: Obstetrics & Gynecology

## 2016-07-14 NOTE — Telephone Encounter (Signed)
Medication refill request: Wellbutrin  Last AEX:  10-27-15  Next AEX: 02-17-17  Last MMG (if hormonal medication request): 07-06-15 WNL  Refill authorized: please advise

## 2016-07-19 ENCOUNTER — Encounter: Payer: Self-pay | Admitting: Family Medicine

## 2016-07-19 DIAGNOSIS — Z1231 Encounter for screening mammogram for malignant neoplasm of breast: Secondary | ICD-10-CM | POA: Diagnosis not present

## 2016-07-19 LAB — HM MAMMOGRAPHY

## 2016-07-21 ENCOUNTER — Encounter: Payer: Self-pay | Admitting: *Deleted

## 2016-08-11 DIAGNOSIS — M50822 Other cervical disc disorders at C5-C6 level: Secondary | ICD-10-CM | POA: Diagnosis not present

## 2016-08-11 DIAGNOSIS — M50823 Other cervical disc disorders at C6-C7 level: Secondary | ICD-10-CM | POA: Diagnosis not present

## 2016-08-11 DIAGNOSIS — G894 Chronic pain syndrome: Secondary | ICD-10-CM | POA: Diagnosis not present

## 2016-08-11 DIAGNOSIS — M5416 Radiculopathy, lumbar region: Secondary | ICD-10-CM | POA: Diagnosis not present

## 2016-09-21 DIAGNOSIS — M47812 Spondylosis without myelopathy or radiculopathy, cervical region: Secondary | ICD-10-CM | POA: Diagnosis not present

## 2016-09-28 DIAGNOSIS — M5416 Radiculopathy, lumbar region: Secondary | ICD-10-CM | POA: Diagnosis not present

## 2016-10-02 ENCOUNTER — Other Ambulatory Visit: Payer: Self-pay | Admitting: Family Medicine

## 2016-10-05 DIAGNOSIS — L821 Other seborrheic keratosis: Secondary | ICD-10-CM | POA: Diagnosis not present

## 2016-10-05 DIAGNOSIS — D2271 Melanocytic nevi of right lower limb, including hip: Secondary | ICD-10-CM | POA: Diagnosis not present

## 2016-10-05 DIAGNOSIS — D225 Melanocytic nevi of trunk: Secondary | ICD-10-CM | POA: Diagnosis not present

## 2016-10-05 DIAGNOSIS — D2272 Melanocytic nevi of left lower limb, including hip: Secondary | ICD-10-CM | POA: Diagnosis not present

## 2016-10-05 DIAGNOSIS — L814 Other melanin hyperpigmentation: Secondary | ICD-10-CM | POA: Diagnosis not present

## 2016-10-05 DIAGNOSIS — Z85828 Personal history of other malignant neoplasm of skin: Secondary | ICD-10-CM | POA: Diagnosis not present

## 2016-10-05 DIAGNOSIS — D1801 Hemangioma of skin and subcutaneous tissue: Secondary | ICD-10-CM | POA: Diagnosis not present

## 2016-10-05 DIAGNOSIS — L570 Actinic keratosis: Secondary | ICD-10-CM | POA: Diagnosis not present

## 2016-10-05 DIAGNOSIS — Z419 Encounter for procedure for purposes other than remedying health state, unspecified: Secondary | ICD-10-CM | POA: Diagnosis not present

## 2016-10-26 DIAGNOSIS — M50823 Other cervical disc disorders at C6-C7 level: Secondary | ICD-10-CM | POA: Diagnosis not present

## 2016-10-26 DIAGNOSIS — M50822 Other cervical disc disorders at C5-C6 level: Secondary | ICD-10-CM | POA: Diagnosis not present

## 2016-10-26 DIAGNOSIS — M5136 Other intervertebral disc degeneration, lumbar region: Secondary | ICD-10-CM | POA: Diagnosis not present

## 2016-10-26 DIAGNOSIS — M47812 Spondylosis without myelopathy or radiculopathy, cervical region: Secondary | ICD-10-CM | POA: Diagnosis not present

## 2016-12-08 ENCOUNTER — Encounter: Payer: Self-pay | Admitting: Family Medicine

## 2016-12-09 ENCOUNTER — Telehealth: Payer: Self-pay | Admitting: Family Medicine

## 2016-12-09 MED ORDER — ZOLPIDEM TARTRATE 10 MG PO TABS: ORAL_TABLET | ORAL | 3 refills | Status: DC

## 2016-12-09 NOTE — Telephone Encounter (Signed)
Rx faxed

## 2016-12-09 NOTE — Telephone Encounter (Signed)
ambien printed, please send to her requested pharmacy

## 2016-12-12 ENCOUNTER — Ambulatory Visit: Payer: PPO

## 2016-12-12 ENCOUNTER — Encounter: Payer: Self-pay | Admitting: Podiatry

## 2016-12-12 ENCOUNTER — Ambulatory Visit: Payer: PPO | Admitting: Podiatry

## 2016-12-12 DIAGNOSIS — G629 Polyneuropathy, unspecified: Secondary | ICD-10-CM

## 2016-12-12 DIAGNOSIS — S99912A Unspecified injury of left ankle, initial encounter: Secondary | ICD-10-CM

## 2016-12-12 MED ORDER — GABAPENTIN 300 MG PO CAPS: 300.0000 mg | ORAL_CAPSULE | Freq: Three times a day (TID) | ORAL | 3 refills | Status: DC

## 2016-12-16 NOTE — Progress Notes (Signed)
Subjective:    Patient ID: Nichole [name], female   DOB: 66 y.o.   MRN: [id number]   HPI patient presents stating that I am doing better with medication but I still gets burning in both my feet    ROS      Objective:  Physical Exam neurovascular status intact with patient still found to have diminishment of sharp dull and vibratory but no significant advancement     Assessment:   Neuropathy symptomatology present      Plan:  Advised this patient on neuropathy-like symptoms and at this point we are getting continue gabapentin M or switching to 300 mg and 100 mg and I discussed this with patient

## 2016-12-29 DIAGNOSIS — M47812 Spondylosis without myelopathy or radiculopathy, cervical region: Secondary | ICD-10-CM | POA: Diagnosis not present

## 2016-12-30 ENCOUNTER — Other Ambulatory Visit: Payer: Self-pay | Admitting: *Deleted

## 2016-12-30 MED ORDER — AMLODIPINE BESYLATE 2.5 MG PO TABS: 2.5000 mg | ORAL_TABLET | Freq: Every day | ORAL | 1 refills | Status: DC

## 2017-01-02 ENCOUNTER — Encounter: Payer: PPO | Admitting: Family Medicine

## 2017-01-09 NOTE — Progress Notes (Addendum)
Subjective:   Nichole [name] is a 66 y.o. female who presents for Medicare Annual (Subsequent) preventive examination.  Review of Systems:  No ROS.  Medicare Wellness Visit. Additional risk factors are reflected in the social history.  Cardiac Risk Factors include: advanced age (>24men, >54 women);dyslipidemia;family history of premature cardiovascular disease   Sleep patterns: Sleeps 6 hours.  Home Safety/Smoke Alarms: Feels safe in home. Smoke alarms in place.  Living environment; residence and Firearm Safety: Lives with husband and grandchildren in 2 story home.  Seat Belt Safety/Bike Helmet: Wears seat belt.   Female:   [id number]     Mammo-07/19/2016, benign.       Dexa scan-08/08/2014, pt reports normal. Ordered today, [facility].           CCS-Colonoscopy 01/03/2013, normal. Recall 5 years.      Objective:     Vitals: BP 118/68 (BP Location: Left Arm, Patient Position: Sitting, Cuff Size: Normal)   Pulse 74   Temp 98.1 F (36.7 C) (Oral)   Resp 18   Ht 5' (1.524 m)   Wt 119 lb 12.8 oz (54.3 kg)   SpO2 98%   BMI 23.40 kg/m   Body mass index is 23.4 kg/m.  Advanced Directives 01/10/2017  Does Patient Have a Medical Advance Directive? Yes  Type of Paramedic of [place];Living will  Copy of [place] in Chart? No - copy requested    Tobacco Social History   Tobacco Use  Smoking Status Never Smoker  Smokeless Tobacco Never Used     Counseling given: Not Answered     Past Medical History:  Diagnosis Date  . Anxiety   . Asthma   . Basal cell carcinoma   . Capsulitis of foot 05/02/2012   acute capsulitis 2nd mpj bilateral .  . Chronic headaches   . Colon polyp    tubular adenoma  . Depression (emotion)   . Elevated hemoglobin A1c   . Fatigue   . Hyperlipidemia   . Hyponatremia    2012  . IBS (irritable bowel syndrome)   . Insomnia   . Neuropathy    fingers/feet  . Osteoarthritis   . Osteopenia   . Raynaud's  disease /phenomenon   . Rosacea    Past Surgical History:  Procedure Laterality Date  . BREAST REDUCTION SURGERY N/A    complications of blood supply  . BUNIONECTOMY Bilateral   . CARDIAC CATHETERIZATION     06/2005  . DENTAL SURGERY    . DIGIT NAIL REMOVAL Right    fingernail removed 4th  . FACIAL COSMETIC SURGERY    . VAGINAL HYSTERECTOMY N/A    Family History  Problem Relation Age of Onset  . Dementia Mother   . Cancer Father   . Hypertension Father   . Fibromyalgia Sister   . Heart attack Maternal Aunt   . Heart attack Maternal Uncle   . Cancer Paternal Aunt   . Breast cancer Paternal Aunt   . Hypertension Sister   . Breast cancer Cousin   . Heart attack Maternal Uncle   . Rheum arthritis Paternal Grandmother    Social History   Socioeconomic History  . Marital status: Married    Spouse name: None  . Number of children: None  . Years of education: None  . Highest education level: None  Social Needs  . Financial resource strain: None  . Food insecurity - worry: None  . Food insecurity - inability: None  .  Transportation needs - medical: None  . Transportation needs - non-medical: None  Occupational History  . None  Tobacco Use  . Smoking status: Never Smoker  . Smokeless tobacco: Never Used  Substance and Sexual Activity  . Alcohol use: No  . Drug use: No  . Sexual activity: Yes    Birth control/protection: Post-menopausal    Comment: maybe once a month due to vaginal dryness  Other Topics Concern  . None  Social History Narrative  . None    Outpatient Encounter Medications as of 01/10/2017  Medication Sig  . amLODipine (NORVASC) 2.5 MG tablet Take 1 tablet (2.5 mg total) by mouth daily. (For Raynauds )  . aspirin 500 MG tablet Take 500 mg by mouth every 6 (six) hours as needed for pain.  Marland Kitchen buPROPion (WELLBUTRIN SR) 150 MG 12 hr tablet TAKE 1 TABLET (150 MG TOTAL) BY MOUTH 2 (TWO) TIMES DAILY.  . Calcium Carbonate-Vit D-Min (CALCIUM 1200 PO) Take  600 mg by mouth.  Marland Kitchen FINACEA 15 % cream   . fluticasone (FLONASE) 50 MCG/ACT nasal spray Place 2 sprays into both nostrils daily.  Marland Kitchen gabapentin (NEURONTIN) 100 MG capsule TAKE 1 CAPSULE (100 MG TOTAL) BY MOUTH 2 (TWO) TIMES DAILY.  Marland Kitchen gabapentin (NEURONTIN) 300 MG capsule Take 1 capsule (300 mg total) 3 (three) times daily by mouth.  Marland Kitchen HYDROmorphone (DILAUDID) 2 MG tablet   . lidocaine (LIDODERM) 5 % Place 1 patch onto the skin.  Marland Kitchen loratadine (CLARITIN) 10 MG tablet Take 10 mg by mouth daily. Pt breaks tablet into fourths and only takes one fourth.  . methocarbamol (ROBAXIN) 750 MG tablet Take 750 mg by mouth every 6 (six) hours as needed for muscle spasms.  . niacin 100 MG tablet Take 100 mg by mouth at bedtime.  . NON FORMULARY Nerve Renew  . Omega 3 1000 MG CAPS Take by mouth.  . Polyethylene Glycol 3350 (MIRALAX PO) Take by mouth.  . rosuvastatin (CRESTOR) 10 MG tablet Take 1 tablet (10 mg total) by mouth daily.  Marland Kitchen VITAMIN E PO Take by mouth.  . zolpidem (AMBIEN) 10 MG tablet 1/2 tab nightly prn  . aspirin 81 MG tablet Take 81 mg by mouth daily.  . Cinnamon 500 MG capsule Take 500 mg by mouth daily. Reported on 03/27/2015  . Probiotic Product (PROBIOTIC ADVANCED PO) Take by mouth.  . Zoster Vaccine Adjuvanted [facility]) injection Inject 0.5 mLs into the muscle once for 1 dose.   No facility-administered encounter medications on file as of 01/10/2017.     Activities of Daily Living In your present state of health, do you have any difficulty performing the following activities: 01/10/2017  Hearing? N  Vision? N  Difficulty concentrating or making decisions? N  Walking or climbing stairs? N  Dressing or bathing? N  Doing errands, shopping? N  Preparing Food and eating ? N  Using the Toilet? N  In the past six months, have you accidently leaked urine? N  Do you have problems with loss of bowel control? N  Managing your Medications? N  Managing your Finances? N  Housekeeping or  managing your Housekeeping? N  Some recent data might be hidden    Patient Care Team: Ma Hillock, DO as PCP - General (Family Medicine) Suella Broad, MD as Consulting Physician (Physical Medicine and Rehabilitation) Megan Salon, MD as Consulting Physician (Gynecology) Richmond Campbell, MD as Consulting Physician (Gastroenterology) Regal, Tamala Fothergill, DPM as Consulting Physician (Podiatry) Rolm Bookbinder, MD as  Consulting Physician (Dermatology) Ralene Bathe, MD as Consulting Physician (Ophthalmology)    Assessment:   This is a routine wellness examination for Rayya.  Exercise Activities and Dietary recommendations Current Exercise Habits: The patient does not participate in regular exercise at present, Exercise limited by: orthopedic condition(s);neurologic condition(s)   Diet (meal preparation, eat out, water intake, caffeinated beverages, dairy products, fruits and vegetables): Drinks diet soda and water.   Attempts to eat heart healthy meals. 3 meals/day.   Goals    . Increase physical activity     Increase activity.        Fall Risk Fall Risk  01/10/2017 12/31/2015  Falls in the past year? Yes No  Number falls in past yr: 1 -  Injury with Fall? No -  Follow up Falls prevention discussed -     Depression Screen PHQ 2/9 Scores 01/10/2017 12/31/2015  PHQ - 2 Score 1 4  PHQ- 9 Score 6 9     Cognitive Function       Ad8 score reviewed for issues:  Issues making decisions: no  Less interest in hobbies / activities: no  Repeats questions, stories (family complaining): no  Trouble using ordinary gadgets (microwave, computer, phone): no  Forgets the month or year: no  Mismanaging finances: no  Remembering appts: no  Daily problems with thinking and/or memory: no Ad8 score is=0     Immunization History  Administered Date(s) Administered  . Pneumococcal Conjugate-13 12/31/2015  . Pneumococcal Polysaccharide-23 05/01/2014  . Tdap 05/01/2014       Screening Tests Health Maintenance  Topic Date Due  . INFLUENZA VACCINE  12/30/2017 (Originally 08/24/2016)  . MAMMOGRAM  07/19/2017  . COLONOSCOPY  01/03/2018  . TETANUS/TDAP  04/30/2024  . DEXA SCAN  Completed  . Hepatitis C Screening  Completed  . PNA vac Low Risk Adult  Completed        Plan:     Schedule bone scan.   Shingles vaccine at pharmacy.   Bring a copy of your living will and/or healthcare power of attorney to your next office visit.  Continue doing brain stimulating activities (puzzles, reading, adult coloring books, staying active) to keep memory sharp.   I have personally reviewed and noted the following in the patient's chart:   . Medical and social history . Use of alcohol, tobacco or illicit drugs  . Current medications and supplements . Functional ability and status . Nutritional status . Physical activity . Advanced directives . List of other physicians . Hospitalizations, surgeries, and ER visits in previous 12 months . Vitals . Screenings to include cognitive, depression, and falls . Referrals and appointments  In addition, I have reviewed and discussed with patient certain preventive protocols, quality metrics, and best practice recommendations. A written personalized care plan for preventive services as well as general preventive health recommendations were provided to patient.     Gerilyn Nestle, RN  01/10/2017   Medical screening examination/treatment/procedure(s) were performed by non-physician practitioner and as supervising physician I was immediately available for consultation/collaboration.  I agree with above assessment and plan.  Electronically Signed by: Howard Pouch, DO [place] [facility] [place]

## 2017-01-10 ENCOUNTER — Ambulatory Visit (INDEPENDENT_AMBULATORY_CARE_PROVIDER_SITE_OTHER): Payer: PPO | Admitting: Family Medicine

## 2017-01-10 ENCOUNTER — Other Ambulatory Visit: Payer: Self-pay

## 2017-01-10 ENCOUNTER — Ambulatory Visit: Payer: PPO

## 2017-01-10 ENCOUNTER — Encounter: Payer: Self-pay | Admitting: Family Medicine

## 2017-01-10 VITALS — BP 118/68 | HR 74 | Temp 98.1°F | Resp 18 | Ht 60.0 in | Wt 119.8 lb

## 2017-01-10 DIAGNOSIS — R7309 Other abnormal glucose: Secondary | ICD-10-CM

## 2017-01-10 DIAGNOSIS — I7300 Raynaud's syndrome without gangrene: Secondary | ICD-10-CM | POA: Diagnosis not present

## 2017-01-10 DIAGNOSIS — F329 Major depressive disorder, single episode, unspecified: Secondary | ICD-10-CM

## 2017-01-10 DIAGNOSIS — Z0000 Encounter for general adult medical examination without abnormal findings: Secondary | ICD-10-CM

## 2017-01-10 DIAGNOSIS — F419 Anxiety disorder, unspecified: Secondary | ICD-10-CM

## 2017-01-10 DIAGNOSIS — E559 Vitamin D deficiency, unspecified: Secondary | ICD-10-CM

## 2017-01-10 DIAGNOSIS — E785 Hyperlipidemia, unspecified: Secondary | ICD-10-CM | POA: Diagnosis not present

## 2017-01-10 DIAGNOSIS — Z23 Encounter for immunization: Secondary | ICD-10-CM

## 2017-01-10 DIAGNOSIS — E2839 Other primary ovarian failure: Secondary | ICD-10-CM | POA: Diagnosis not present

## 2017-01-10 LAB — CBC WITH DIFFERENTIAL/PLATELET
BASOS PCT: 1.1 % (ref 0.0–3.0)
Basophils Absolute: 0.1 10*3/uL (ref 0.0–0.1)
EOS PCT: 2 % (ref 0.0–5.0)
Eosinophils Absolute: 0.1 10*3/uL (ref 0.0–0.7)
HEMATOCRIT: 40.9 % (ref 36.0–46.0)
HEMOGLOBIN: 13.5 g/dL (ref 12.0–15.0)
LYMPHS PCT: 28.2 % (ref 12.0–46.0)
Lymphs Abs: 1.7 10*3/uL (ref 0.7–4.0)
MCHC: 33.1 g/dL (ref 30.0–36.0)
MCV: 94.9 fl (ref 78.0–100.0)
MONOS PCT: 10.1 % (ref 3.0–12.0)
Monocytes Absolute: 0.6 10*3/uL (ref 0.1–1.0)
Neutro Abs: 3.6 10*3/uL (ref 1.4–7.7)
Neutrophils Relative %: 58.6 % (ref 43.0–77.0)
Platelets: 313 10*3/uL (ref 150.0–400.0)
RBC: 4.31 Mil/uL (ref 3.87–5.11)
RDW: 13.1 % (ref 11.5–15.5)
WBC: 6.2 10*3/uL (ref 4.0–10.5)

## 2017-01-10 LAB — COMPREHENSIVE METABOLIC PANEL
ALT: 25 U/L (ref 0–35)
AST: 24 U/L (ref 0–37)
Albumin: 4.2 g/dL (ref 3.5–5.2)
Alkaline Phosphatase: 58 U/L (ref 39–117)
BUN: 10 mg/dL (ref 6–23)
CO2: 31 mEq/L (ref 19–32)
CREATININE: 0.85 mg/dL (ref 0.40–1.20)
Calcium: 9.9 mg/dL (ref 8.4–10.5)
Chloride: 103 mEq/L (ref 96–112)
GFR: 70.57 mL/min (ref >60.00)
GLUCOSE: 86 mg/dL (ref 70–99)
POTASSIUM: 4.2 meq/L (ref 3.5–5.1)
Sodium: 141 mEq/L (ref 135–145)
TOTAL PROTEIN: 6.6 g/dL (ref 6.0–8.3)
Total Bilirubin: 0.4 mg/dL (ref 0.2–1.2)

## 2017-01-10 LAB — HEMOGLOBIN A1C: HEMOGLOBIN A1C: 6.1 % (ref 4.6–6.5)

## 2017-01-10 LAB — LIPID PANEL
Cholesterol: 182 mg/dL (ref 0–200)
HDL: 58.1 mg/dL (ref >39.00)
LDL CALC: 89 mg/dL (ref 0–99)
NONHDL: 123.96
Total CHOL/HDL Ratio: 3
Triglycerides: 173 mg/dL — ABNORMAL HIGH (ref 0.0–149.0)
VLDL: 34.6 mg/dL (ref 0.0–40.0)

## 2017-01-10 LAB — VITAMIN D 25 HYDROXY (VIT D DEFICIENCY, FRACTURES): VITD: 44.53 ng/mL (ref 30.00–100.00)

## 2017-01-10 LAB — TSH: TSH: 1.98 u[IU]/mL (ref 0.35–4.50)

## 2017-01-10 MED ORDER — ZOSTER VAC RECOMB ADJUVANTED 50 MCG/0.5ML IM SUSR: 0.5000 mL | Freq: Once | INTRAMUSCULAR | 1 refills | Status: AC

## 2017-01-10 MED ORDER — AMLODIPINE BESYLATE 2.5 MG PO TABS: 2.5000 mg | ORAL_TABLET | Freq: Every day | ORAL | 3 refills | Status: DC

## 2017-01-10 MED ORDER — ROSUVASTATIN CALCIUM 10 MG PO TABS: 10.0000 mg | ORAL_TABLET | Freq: Every day | ORAL | 3 refills | Status: DC

## 2017-01-10 NOTE — Progress Notes (Signed)
Patient ID: Nichole [name], female  DOB: 07/16/1950, 66 y.o.   MRN: [id number] Patient Care Team    Relationship Specialty Notifications Start End  Ma Hillock, DO PCP - General Family Medicine  12/24/14   Suella Broad, MD Consulting Physician Physical Medicine and Rehabilitation  12/31/15    Comment: pain mgmt  Megan Salon, MD Consulting Physician Gynecology  12/31/15   Richmond Campbell, MD Consulting Physician Gastroenterology  12/31/15   Wallene Huh, DPM Consulting Physician Podiatry  01/10/17   Rolm Bookbinder, MD Consulting Physician Dermatology  01/10/17   Ralene Bathe, MD Consulting Physician Ophthalmology  01/10/17   Toribio Harbour, [place] Referring Physician Chiropractic Medicine  01/10/17     Subjective:  Nichole [name] is a 66 y.o.  Female  present for CPE All past medical history, surgical history, allergies, family history, immunizations, medications and social history were updated in the electronic medical record today. All recent labs, ED visits and hospitalizations within the last year were reviewed. Nichole [name]  Health maintenance: updated 01/10/2017 Colonoscopy: completed 01/03/2013, 5 year recall, prior h/o polyps, Dr. Earlean Shawl Mammogram: completed:06/2016, NL, rpt yearly, completed through GYN.  Cervical cancer screening: >65 not indicated, ast pap: 2015, results: neg, completed by: Dr. Sabra Heck (GYN) follows with yearly.  Immunizations: tdap 05/01/2014, Influenza declined (encouraged yearly), PNA series completed, shingrix script provided with wellness visit Infectious disease screening: Hep C completed DEXA: last completed 07/2014--> repeat ordered at wellness Assistive device: none Oxygen OEU:MPNT Patient has a Dental home. Hospitalizations/ED visits: None  Immunization History  Administered Date(s) Administered  . Pneumococcal Conjugate-13 12/31/2015  . Pneumococcal Polysaccharide-23 05/01/2014  . Tdap 05/01/2014    Past Medical History:  Diagnosis  Date  . Anxiety   . Asthma   . Basal cell carcinoma   . Capsulitis of foot 05/02/2012   acute capsulitis 2nd mpj bilateral .  . Chronic headaches   . Colon polyp    tubular adenoma  . Constipation   . Depression (emotion)   . Elevated hemoglobin A1c   . Fatigue   . Frequent headaches   . Hyperlipidemia   . Hyponatremia    2012  . IBS (irritable bowel syndrome)   . Insomnia   . Neuropathy    fingers/feet  . Osteoarthritis   . Osteopenia   . Raynaud's disease /phenomenon   . Rosacea   . Tinnitus aurium, bilateral    Allergies  Allergen Reactions  . Codeine     Crawling skin , weird twilight dreams  . Tramadol     Itching    Past Surgical History:  Procedure Laterality Date  . BREAST REDUCTION SURGERY N/A    complications of blood supply  . BUNIONECTOMY Bilateral   . CARDIAC CATHETERIZATION     06/2005  . DENTAL SURGERY    . DIGIT NAIL REMOVAL Right    fingernail removed 4th  . FACIAL COSMETIC SURGERY    . VAGINAL HYSTERECTOMY N/A    Family History  Problem Relation Age of Onset  . Dementia Mother   . Cancer Father   . Hypertension Father   . Fibromyalgia Sister   . Heart attack Maternal Aunt   . Heart attack Maternal Uncle   . Cancer Paternal Aunt   . Breast cancer Paternal Aunt   . Hypertension Sister   . Breast cancer Cousin   . Heart attack Maternal Uncle   . Rheum arthritis Paternal Grandmother    Social History  Socioeconomic History  . Marital status: Married    Spouse name: Not on file  . Number of children: Not on file  . Years of education: Not on file  . Highest education level: Not on file  Social Needs  . Financial resource strain: Not on file  . Food insecurity - worry: Not on file  . Food insecurity - inability: Not on file  . Transportation needs - medical: Not on file  . Transportation needs - non-medical: Not on file  Occupational History  . Not on file  Tobacco Use  . Smoking status: Never Smoker  . Smokeless tobacco:  Never Used  Substance and Sexual Activity  . Alcohol use: No  . Drug use: No  . Sexual activity: Yes    Birth control/protection: Post-menopausal    Comment: maybe once a month due to vaginal dryness  Other Topics Concern  . Not on file  Social History Narrative  . Not on file   Allergies as of 01/10/2017      Reactions   Codeine    Crawling skin , weird twilight dreams   Tramadol    Itching      Medication List        Accurate as of 01/10/17  9:37 AM. Always use your most recent med list.          amLODipine 2.5 MG tablet Commonly known as:  NORVASC Take 1 tablet (2.5 mg total) by mouth daily. (For Raynauds )   aspirin 81 MG tablet Take 81 mg by mouth daily.   aspirin 500 MG tablet Take 500 mg by mouth every 6 (six) hours as needed for pain.   buPROPion 150 MG 12 hr tablet Commonly known as:  WELLBUTRIN SR TAKE 1 TABLET (150 MG TOTAL) BY MOUTH 2 (TWO) TIMES DAILY.   CALCIUM 1200 PO Take 600 mg by mouth.   Cinnamon 500 MG capsule Take 500 mg by mouth daily. Reported on 03/27/2015   FINACEA 15 % cream Generic drug:  Azelaic Acid   fluticasone 50 MCG/ACT nasal spray Commonly known as:  FLONASE Place 2 sprays into both nostrils daily.   gabapentin 100 MG capsule Commonly known as:  NEURONTIN TAKE 1 CAPSULE (100 MG TOTAL) BY MOUTH 2 (TWO) TIMES DAILY.   gabapentin 300 MG capsule Commonly known as:  NEURONTIN Take 1 capsule (300 mg total) 3 (three) times daily by mouth.   HYDROmorphone 2 MG tablet Commonly known as:  DILAUDID   lidocaine 5 % Commonly known as:  LIDODERM Place 1 patch onto the skin.   loratadine 10 MG tablet Commonly known as:  CLARITIN Take 10 mg by mouth daily. Pt breaks tablet into fourths and only takes one fourth.   methocarbamol 750 MG tablet Commonly known as:  ROBAXIN Take 750 mg by mouth every 6 (six) hours as needed for muscle spasms.   MIRALAX PO Take by mouth.   niacin 100 MG tablet Take 100 mg by mouth at  bedtime.   NON FORMULARY Nerve Renew   Omega 3 1000 MG Caps Take by mouth.   PROBIOTIC ADVANCED PO Take by mouth.   rosuvastatin 10 MG tablet Commonly known as:  CRESTOR Take 1 tablet (10 mg total) by mouth daily.   VITAMIN E PO Take by mouth.   zolpidem 10 MG tablet Commonly known as:  AMBIEN 1/2 tab nightly prn   Zoster Vaccine Adjuvanted injection Commonly known as:  SHINGRIX Inject 0.5 mLs into the muscle once for 1  dose.        No results found for this or any previous visit (from the past 2160 hour(s)).  Dg Chest 2 View  Result Date: 06/04/2009 Clinical Data: Cough and congestion.  CHEST - 2 VIEW  Comparison: 06/22/2005  Findings: Trachea is midline.  Heart size normal.  Lungs are clear. No pleural fluid.  IMPRESSION: No acute findings. Provider: Pearla Dubonnet    ROS: 14 pt review of systems performed and negative (unless mentioned in an HPI)  Objective: BP 118/68 (BP Location: Left Arm, Patient Position: Sitting, Cuff Size: Normal)   Pulse 74   Temp 98.1 F (36.7 C) (Oral)   Resp 18   Ht 5' (1.524 m)   Wt 119 lb 12.8 oz (54.3 kg)   SpO2 98%   BMI 23.40 kg/m  Gen: Afebrile. No acute distress. Nontoxic in appearance, well developed, well nourished. Pleasant caucasian female.  HENT: AT. [place]. Bilateral TM visualized and normal in appearance. MMM. Bilateral nares without erythema or drainage. Throat without erythema or exudates. Mild cough present  Eyes:Pupils Equal Round Reactive to light, Extraocular movements intact,  Conjunctiva without redness, discharge or icterus. Neck/lymp/endocrine: Supple,no lymphadenopathy, no thyromegaly CV: RRR no murmur, no edema, +2/4 P posterior tibialis pulses Chest: CTAB, no wheeze or crackles Abd: Soft. flat. NTND. BS present. No  Masses palpated.  Skin: no rashes, purpura or petechiae.  Neuro/msk:  Normal gait. PERLA. EOMi. Alert. Oriented. Cranial nerves II through XII intact. Muscle strength 5/5 bilateral extremity. DTRs  equal bilaterally. Psych: Normal affect, dress and demeanor. Normal speech. Normal thought content and judgment.   Assessment/plan: SHLOKA BALDRIDGE is a 66 y.o. female present for CPE. Estrogen deficiency - DG Bone Density; Future Need for shingles vaccine - Zoster Vaccine Adjuvanted [facility]) injection; Inject 0.5 mLs into the muscle once for 1 dose.  Dispense: 0.5 mL; Refill: 1 Vitamin D deficiency - takes  A calcium/vit d supplement now, uncertain Vit D dosage. - Vitamin D (25 hydroxy) Anxiety and depression - medications managed by GYN - TSH Hyperlipidemia, unspecified hyperlipidemia type - continue crestor 10 mg QD, refills provided today. - CBC w/Diff - Comp Met (CMET) - Lipid panel - TSH Elevated hemoglobin A1c - HgB A1c Raynaud's/paroxysmal digital cyanosis: - refilled amlodipine 1 year  Encounter for preventive health examination Patient was encouraged to exercise greater than 150 minutes a week. Patient was encouraged to choose a diet filled with fresh fruits and vegetables, and lean meats. AVS provided to patient today for education/recommendation on gender specific health and safety maintenance. Colonoscopy: completed 01/03/2013, 5 year recall, prior h/o polyps, Dr. Earlean Shawl Mammogram: completed:06/2016, NL, rpt yearly, completed through GYN.  Cervical cancer screening: >65 not indicated, ast pap: 2015, results: neg, completed by: Dr. Sabra Heck (GYN) follows with yearly.  Immunizations: tdap 05/01/2014, Influenza declined (encouraged yearly), PNA series completed, shingrix script provided with wellness visit Infectious disease screening: Hep C completed DEXA: last completed 07/2014--> repeat ordered at wellness   Return in about 1 year (around 01/10/2018) for CPE.  Electronically signed by: Howard Pouch, DO [place]

## 2017-01-10 NOTE — Patient Instructions (Signed)
Schedule bone scan.   Shingles vaccine at pharmacy.   Bring a copy of your living will and/or healthcare power of attorney to your next office visit.  Continue doing brain stimulating activities (puzzles, reading, adult coloring books, staying active) to keep memory sharp.   Health Maintenance, Female Adopting a healthy lifestyle and getting preventive care can go a long way to promote health and wellness. Talk with your health care provider about what schedule of regular examinations is right for you. This is a good chance for you to check in with your provider about disease prevention and staying healthy. In between checkups, there are plenty of things you can do on your own. Experts have done a lot of research about which lifestyle changes and preventive measures are most likely to keep you healthy. Ask your health care provider for more information. Weight and diet Eat a healthy diet  Be sure to include plenty of vegetables, fruits, low-fat dairy products, and lean protein.  Do not eat a lot of foods high in solid fats, added sugars, or salt.  Get regular exercise. This is one of the most important things you can do for your health. ? Most adults should exercise for at least 150 minutes each week. The exercise should increase your heart rate and make you sweat (moderate-intensity exercise). ? Most adults should also do strengthening exercises at least twice a week. This is in addition to the moderate-intensity exercise.  Maintain a healthy weight  Body mass index (BMI) is a measurement that can be used to identify possible weight problems. It estimates body fat based on height and weight. Your health care provider can help determine your BMI and help you achieve or maintain a healthy weight.  For females 51 years of age and older: ? A BMI below 18.5 is considered underweight. ? A BMI of 18.5 to 24.9 is normal. ? A BMI of 25 to 29.9 is considered overweight. ? A BMI of 30 and above  is considered obese.  Watch levels of cholesterol and blood lipids  You should start having your blood tested for lipids and cholesterol at 68 years of age, then have this test every 5 years.  You may need to have your cholesterol levels checked more often if: ? Your lipid or cholesterol levels are high. ? You are older than 68 years of age. ? You are at high risk for heart disease.  Cancer screening Lung Cancer  Lung cancer screening is recommended for adults 55-82 years old who are at high risk for lung cancer because of a history of smoking.  A yearly low-dose CT scan of the lungs is recommended for people who: ? Currently smoke. ? Have quit within the past 15 years. ? Have at least a 30-pack-year history of smoking. A pack year is smoking an average of one pack of cigarettes a day for 1 year.  Yearly screening should continue until it has been 15 years since you quit.  Yearly screening should stop if you develop a health problem that would prevent you from having lung cancer treatment.  Breast Cancer  Practice breast self-awareness. This means understanding how your breasts normally appear and feel.  It also means doing regular breast self-exams. Let your health care provider know about any changes, no matter how small.  If you are in your 20s or 30s, you should have a clinical breast exam (CBE) by a health care provider every 1-3 years as part of a regular health exam.  If you are 40 or older, have a CBE every year. Also consider having a breast X-ray (mammogram) every year.  If you have a family history of breast cancer, talk to your health care provider about genetic screening.  If you are at high risk for breast cancer, talk to your health care provider about having an MRI and a mammogram every year.  Breast cancer gene (BRCA) assessment is recommended for women who have family members with BRCA-related cancers. BRCA-related cancers  include: ? Breast. ? Ovarian. ? Tubal. ? Peritoneal cancers.  Results of the assessment will determine the need for genetic counseling and BRCA1 and BRCA2 testing.  Cervical Cancer Your health care provider may recommend that you be screened regularly for cancer of the pelvic organs (ovaries, uterus, and vagina). This screening involves a pelvic examination, including checking for microscopic changes to the surface of your cervix (Pap test). You may be encouraged to have this screening done every 3 years, beginning at age 21.  For women ages 30-65, health care providers may recommend pelvic exams and Pap testing every 3 years, or they may recommend the Pap and pelvic exam, combined with testing for human papilloma virus (HPV), every 5 years. Some types of HPV increase your risk of cervical cancer. Testing for HPV may also be done on women of any age with unclear Pap test results.  Other health care providers may not recommend any screening for nonpregnant women who are considered low risk for pelvic cancer and who do not have symptoms. Ask your health care provider if a screening pelvic exam is right for you.  If you have had past treatment for cervical cancer or a condition that could lead to cancer, you need Pap tests and screening for cancer for at least 20 years after your treatment. If Pap tests have been discontinued, your risk factors (such as having a new sexual partner) need to be reassessed to determine if screening should resume. Some women have medical problems that increase the chance of getting cervical cancer. In these cases, your health care provider may recommend more frequent screening and Pap tests.  Colorectal Cancer  This type of cancer can be detected and often prevented.  Routine colorectal cancer screening usually begins at 68 years of age and continues through 68 years of age.  Your health care provider may recommend screening at an earlier age if you have risk factors  for colon cancer.  Your health care provider may also recommend using home test kits to check for hidden blood in the stool.  A small camera at the end of a tube can be used to examine your colon directly (sigmoidoscopy or colonoscopy). This is done to check for the earliest forms of colorectal cancer.  Routine screening usually begins at age 50.  Direct examination of the colon should be repeated every 5-10 years through 68 years of age. However, you may need to be screened more often if early forms of precancerous polyps or small growths are found.  Skin Cancer  Check your skin from head to toe regularly.  Tell your health care provider about any new moles or changes in moles, especially if there is a change in a mole's shape or color.  Also tell your health care provider if you have a mole that is larger than the size of a pencil eraser.  Always use sunscreen. Apply sunscreen liberally and repeatedly throughout the day.  Protect yourself by wearing long sleeves, pants, a wide-brimmed hat, and   sunglasses whenever you are outside.  Heart disease, diabetes, and high blood pressure  High blood pressure causes heart disease and increases the risk of stroke. High blood pressure is more likely to develop in: ? People who have blood pressure in the high end of the normal range (130-139/85-89 mm Hg). ? People who are overweight or obese. ? People who are African American.  If you are 25-70 years of age, have your blood pressure checked every 3-5 years. If you are 86 years of age or older, have your blood pressure checked every year. You should have your blood pressure measured twice-once when you are at a hospital or clinic, and once when you are not at a hospital or clinic. Record the average of the two measurements. To check your blood pressure when you are not at a hospital or clinic, you can use: ? An automated blood pressure machine at a pharmacy. ? A home blood pressure monitor.  If  you are between 34 years and 33 years old, ask your health care provider if you should take aspirin to prevent strokes.  Have regular diabetes screenings. This involves taking a blood sample to check your fasting blood sugar level. ? If you are at a normal weight and have a low risk for diabetes, have this test once every three years after 68 years of age. ? If you are overweight and have a high risk for diabetes, consider being tested at a younger age or more often. Preventing infection Hepatitis B  If you have a higher risk for hepatitis B, you should be screened for this virus. You are considered at high risk for hepatitis B if: ? You were born in a country where hepatitis B is common. Ask your health care provider which countries are considered high risk. ? Your parents were born in a high-risk country, and you have not been immunized against hepatitis B (hepatitis B vaccine). ? You have HIV or AIDS. ? You use needles to inject street drugs. ? You live with someone who has hepatitis B. ? You have had sex with someone who has hepatitis B. ? You get hemodialysis treatment. ? You take certain medicines for conditions, including cancer, organ transplantation, and autoimmune conditions.  Hepatitis C  Blood testing is recommended for: ? Everyone born from [birth year] through 1967. ? Anyone with known risk factors for hepatitis C.  Sexually transmitted infections (STIs)  You should be screened for sexually transmitted infections (STIs) including gonorrhea and chlamydia if: ? You are sexually active and are younger than 68 years of age. ? You are older than 68 years of age and your health care provider tells you that you are at risk for this type of infection. ? Your sexual activity has changed since you were last screened and you are at an increased risk for chlamydia or gonorrhea. Ask your health care provider if you are at risk.  If you do not have HIV, but are at risk, it may be recommended  that you take a prescription medicine daily to prevent HIV infection. This is called pre-exposure prophylaxis (PrEP). You are considered at risk if: ? You are sexually active and do not regularly use condoms or know the HIV status of your partner(s). ? You take drugs by injection. ? You are sexually active with a partner who has HIV.  Talk with your health care provider about whether you are at high risk of being infected with HIV. If you choose to begin PrEP, you  should first be tested for HIV. You should then be tested every 3 months for as long as you are taking PrEP. Pregnancy  If you are premenopausal and you may become pregnant, ask your health care provider about preconception counseling.  If you may become pregnant, take 400 to 800 micrograms (mcg) of folic acid every day.  If you want to prevent pregnancy, talk to your health care provider about birth control (contraception). Osteoporosis and menopause  Osteoporosis is a disease in which the bones lose minerals and strength with aging. This can result in serious bone fractures. Your risk for osteoporosis can be identified using a bone density scan.  If you are 65 years of age or older, or if you are at risk for osteoporosis and fractures, ask your health care provider if you should be screened.  Ask your health care provider whether you should take a calcium or vitamin D supplement to lower your risk for osteoporosis.  Menopause may have certain physical symptoms and risks.  Hormone replacement therapy may reduce some of these symptoms and risks. Talk to your health care provider about whether hormone replacement therapy is right for you. Follow these instructions at home:  Schedule regular health, dental, and eye exams.  Stay current with your immunizations.  Do not use any tobacco products including cigarettes, chewing tobacco, or electronic cigarettes.  If you are pregnant, do not drink alcohol.  If you are  breastfeeding, limit how much and how often you drink alcohol.  Limit alcohol intake to no more than 1 drink per day for nonpregnant women. One drink equals 12 ounces of beer, 5 ounces of wine, or 1 ounces of hard liquor.  Do not use street drugs.  Do not share needles.  Ask your health care provider for help if you need support or information about quitting drugs.  Tell your health care provider if you often feel depressed.  Tell your health care provider if you have ever been abused or do not feel safe at home. This information is not intended to replace advice given to you by your health care provider. Make sure you discuss any questions you have with your health care provider. Document Released: 07/26/2010 Document Revised: 06/18/2015 Document Reviewed: 10/14/2014 [employer] Interactive Patient Education  2018 [employer]  

## 2017-02-07 DIAGNOSIS — H52203 Unspecified astigmatism, bilateral: Secondary | ICD-10-CM | POA: Diagnosis not present

## 2017-02-07 DIAGNOSIS — H5213 Myopia, bilateral: Secondary | ICD-10-CM | POA: Diagnosis not present

## 2017-02-07 DIAGNOSIS — H2513 Age-related nuclear cataract, bilateral: Secondary | ICD-10-CM | POA: Diagnosis not present

## 2017-02-10 DIAGNOSIS — Z780 Asymptomatic menopausal state: Secondary | ICD-10-CM | POA: Diagnosis not present

## 2017-02-10 DIAGNOSIS — Z8262 Family history of osteoporosis: Secondary | ICD-10-CM | POA: Diagnosis not present

## 2017-02-10 LAB — HM DEXA SCAN

## 2017-02-15 DIAGNOSIS — Z79891 Long term (current) use of opiate analgesic: Secondary | ICD-10-CM | POA: Insufficient documentation

## 2017-02-17 ENCOUNTER — Encounter: Payer: Self-pay | Admitting: *Deleted

## 2017-02-17 ENCOUNTER — Ambulatory Visit (INDEPENDENT_AMBULATORY_CARE_PROVIDER_SITE_OTHER): Payer: PPO | Admitting: Obstetrics & Gynecology

## 2017-02-17 ENCOUNTER — Telehealth: Payer: Self-pay | Admitting: Family Medicine

## 2017-02-17 ENCOUNTER — Encounter: Payer: Self-pay | Admitting: Obstetrics & Gynecology

## 2017-02-17 VITALS — BP 106/60 | HR 64 | Resp 16 | Ht 59.25 in | Wt 120.0 lb

## 2017-02-17 DIAGNOSIS — Z01419 Encounter for gynecological examination (general) (routine) without abnormal findings: Secondary | ICD-10-CM | POA: Diagnosis not present

## 2017-02-17 DIAGNOSIS — I7300 Raynaud's syndrome without gangrene: Secondary | ICD-10-CM | POA: Diagnosis not present

## 2017-02-17 MED ORDER — ESTRADIOL 10 MCG VA TABS: ORAL_TABLET | VAGINAL | 0 refills | Status: DC

## 2017-02-17 MED ORDER — ZOLPIDEM TARTRATE 10 MG PO TABS: ORAL_TABLET | ORAL | 3 refills | Status: DC

## 2017-02-17 MED ORDER — AMLODIPINE BESYLATE 2.5 MG PO TABS: 2.5000 mg | ORAL_TABLET | Freq: Every day | ORAL | 3 refills | Status: DC

## 2017-02-17 MED ORDER — BUPROPION HCL ER (SR) 150 MG PO TB12: 150.0000 mg | ORAL_TABLET | Freq: Two times a day (BID) | ORAL | 4 refills | Status: DC

## 2017-02-17 NOTE — Progress Notes (Signed)
67 y.o. [id number] MarriedCaucasianF here for annual exam.  Doing well.  Denies vaginal bleeding.  Having increased issues with vaginal dryness, painful intercourse.  Would like to consider options.  PCP:  Dr. Raoul Pitch  No LMP recorded. Patient has had a hysterectomy.          Sexually active: Yes.    The current method of family planning is status post hysterectomy. Exercising: Yes.    Walking Smoker:  no  Health Maintenance: Pap:  2005 neg History of abnormal Pap:  no MMG:  07/19/16 BIRADS2:Benign. F/u 1 year  Colonoscopy:  01/03/13 Normal. F/u 5 year  BMD:   08/08/14, just did it last week TDaP:  2016 Pneumonia vaccine(s):  2017 Shingrix:   Zostavax done 2011 Hep C testing: 12/24/14 Neg  Screening Labs: PCP   reports that  has never smoked. she has never used smokeless tobacco. She reports that she does not drink alcohol or use drugs.  Past Medical History:  Diagnosis Date  . Anxiety   . Asthma   . Basal cell carcinoma   . Capsulitis of foot 05/02/2012   acute capsulitis 2nd mpj bilateral .  . Chronic headaches   . Colon polyp    tubular adenoma  . Constipation   . Depression (emotion)   . Elevated hemoglobin A1c   . Fatigue   . Frequent headaches   . Hyperlipidemia   . Hyponatremia    2012  . IBS (irritable bowel syndrome)   . Insomnia   . Neuropathy    fingers/feet  . Osteoarthritis   . Osteopenia   . Raynaud's disease /phenomenon   . Rosacea   . Tinnitus aurium, bilateral     Past Surgical History:  Procedure Laterality Date  . BREAST REDUCTION SURGERY N/A    complications of blood supply  . BUNIONECTOMY Bilateral   . CARDIAC CATHETERIZATION     06/2005  . DENTAL SURGERY    . DIGIT NAIL REMOVAL Right    fingernail removed 4th  . FACIAL COSMETIC SURGERY    . VAGINAL HYSTERECTOMY N/A     Current Outpatient Medications  Medication Sig Dispense Refill  . amLODipine (NORVASC) 2.5 MG tablet Take 1 tablet (2.5 mg total) by mouth daily. (For Raynauds ) 90  tablet 3  . aspirin 500 MG tablet Take 500 mg by mouth every 6 (six) hours as needed for pain.    Marland Kitchen buPROPion (WELLBUTRIN SR) 150 MG 12 hr tablet TAKE 1 TABLET (150 MG TOTAL) BY MOUTH 2 (TWO) TIMES DAILY. 180 tablet 1  . Calcium Carbonate-Vit D-Min (CALCIUM 1200 PO) Take 600 mg by mouth.    Marland Kitchen FINACEA 15 % cream     . gabapentin (NEURONTIN) 100 MG capsule TAKE 1 CAPSULE (100 MG TOTAL) BY MOUTH 2 (TWO) TIMES DAILY. (Patient taking differently: TAKE 1 CAPSULE (100 MG TOTAL) BY MOUTH  DAILY.) 90 capsule 7  . gabapentin (NEURONTIN) 300 MG capsule Take 1 capsule (300 mg total) 3 (three) times daily by mouth. (Patient taking differently: Take 300 mg by mouth at bedtime. ) 90 capsule 3  . HYDROmorphone (DILAUDID) 2 MG tablet   0  . lidocaine (LIDODERM) 5 % Place 1 patch onto the skin.    . methocarbamol (ROBAXIN) 750 MG tablet Take 750 mg by mouth every 6 (six) hours as needed for muscle spasms.    . Multiple Vitamin (MULTIVITAMIN) tablet Take 1 tablet by mouth daily.    . niacin 100 MG tablet Take 100 mg by  mouth at bedtime.    . NON FORMULARY Nerve Renew    . Omega 3 1000 MG CAPS Take by mouth.    . rosuvastatin (CRESTOR) 10 MG tablet Take 1 tablet (10 mg total) by mouth daily. 90 tablet 3  . VITAMIN E PO Take by mouth.    . zolpidem (AMBIEN) 10 MG tablet 1/2 tab nightly prn 30 tablet 3  . fluticasone (FLONASE) 50 MCG/ACT nasal spray Place 2 sprays into both nostrils daily. (Patient not taking: Reported on 02/17/2017) 16 g 2  . loratadine (CLARITIN) 10 MG tablet Take 10 mg by mouth daily. Pt breaks tablet into fourths and only takes one fourth.     No current facility-administered medications for this visit.     Family History  Problem Relation Age of Onset  . Dementia Mother   . Cancer Father   . Hypertension Father   . Fibromyalgia Sister   . Heart attack Maternal Aunt   . Heart attack Maternal Uncle   . Cancer Paternal Aunt   . Breast cancer Paternal Aunt   . Hypertension Sister   .  Breast cancer Cousin   . Heart attack Maternal Uncle   . Rheum arthritis Paternal Grandmother     ROS:  Pertinent items are noted in HPI.  Otherwise, a comprehensive ROS was negative.  Exam:   BP 106/60 (BP Location: Right Arm, Patient Position: Sitting, Cuff Size: Normal)   Pulse 64   Resp 16   Ht 4' 11.25" (1.505 m)   Wt 120 lb (54.4 kg)   BMI 24.03 kg/m    Height: 4' 11.25" (150.5 cm)  Ht Readings from Last 3 Encounters:  02/17/17 4' 11.25" (1.505 m)  01/10/17 5' (1.524 m)  12/31/15 5' (1.524 m)    General appearance: alert, cooperative and appears stated age Head: Normocephalic, without obvious abnormality, atraumatic Neck: no adenopathy, supple, symmetrical, trachea midline and thyroid normal to inspection and palpation Lungs: clear to auscultation bilaterally Breasts: normal appearance, no masses or tenderness Heart: regular rate and rhythm Abdomen: soft, non-tender; bowel sounds normal; no masses,  no organomegaly Extremities: extremities normal, atraumatic, no cyanosis or edema Skin: Skin color, texture, turgor normal. No rashes or lesions Lymph nodes: Cervical, supraclavicular, and axillary nodes normal. No abnormal inguinal nodes palpated Neurologic: Grossly normal   Pelvic: External genitalia:  no lesions              Urethra:  normal appearing urethra with no masses, tenderness or lesions              Bartholins and Skenes: normal                 Vagina: normal appearing vagina with normal color and discharge, no lesions              Cervix: absent              Pap taken: No. Bimanual Exam:  Uterus:  uterus absent              Adnexa: no mass, fullness, tenderness               Rectovaginal: Confirms               Anus:  normal sphincter tone, no lesions  Chaperone was present for exam.  A:  Well Woman with normal exam PMP, no HRT H/o breast reduction with complications, now with no nipples areolas bilaterally H/O depression/anxiety S/p TVH, ovaries  remain  Vaginal atrophic changes  P:   Mammogram discussed.  Doing 3D MMG. pap smear not indicated Blood work it UTD--just done in December Vaccines are UTD.  Does not want shingrix vaccination at this point.   Aware colonoscopy is due in December 2019. Trial of vagifem 39meq pv nightly x 14 nights, then decrease to twice weekly.  3 month supply given.  Pt will give update at that point.  Risks of estrogens reviewed including DVT, PE, stroke, MI, breast cancer. Return annually or prn

## 2017-02-17 NOTE — Telephone Encounter (Signed)
Left detailed message with results and instructions on patient voice mail per DPR 

## 2017-02-17 NOTE — Telephone Encounter (Signed)
Please inform patient her bone density is normal. Continue calcium and vitamin D and weightbearing exercises.

## 2017-02-28 ENCOUNTER — Telehealth: Payer: Self-pay | Admitting: *Deleted

## 2017-02-28 MED ORDER — GABAPENTIN 100 MG PO CAPS: ORAL_CAPSULE | ORAL | 7 refills | Status: DC

## 2017-02-28 NOTE — Telephone Encounter (Signed)
Refill request Gabapentin 100mg . Dr. Paulla Dolly states refill as previously.

## 2017-04-14 ENCOUNTER — Other Ambulatory Visit: Payer: Self-pay | Admitting: Obstetrics & Gynecology

## 2017-04-14 ENCOUNTER — Encounter: Payer: Self-pay | Admitting: Obstetrics & Gynecology

## 2017-04-14 MED ORDER — ESTRADIOL 10 MCG VA TABS: ORAL_TABLET | VAGINAL | 1 refills | Status: DC

## 2017-04-14 NOTE — Telephone Encounter (Signed)
Dr.Miller don't you want patient to stay on Yuvafem until her follow up appointment 05/01/17? If so, please complete refill request below.  Medication refill request: Estradiol 10 mcg Last AEX: 02-17-17 Next AEX: 04-26-18 Last MMG (if hormonal medication request): 07-19-16 RUE:[id number] Refill authorized: Please advise

## 2017-04-14 NOTE — Telephone Encounter (Signed)
Patient sent the following correspondence through [facility]. Routing to refill pool to assist patient with request.  ----- Message from [place], Generic sent at 04/14/2017 8:10 AM EDT -----    When I came for my annual visit I was prescribed Yuvafem. Dr. Sabra Heck prescribed the exact number of inserts I would need for the initial 14 days and then enough for 2 x week for 90 days. However, that is not what they gave me. They only supplied for the 90 days. I will use my last insert tomorrow and my follow-up visit isn't until 4/8. Will that be a problem or should I get another prescription to get me through?

## 2017-04-15 ENCOUNTER — Encounter: Payer: Self-pay | Admitting: Obstetrics & Gynecology

## 2017-04-16 ENCOUNTER — Encounter: Payer: Self-pay | Admitting: Obstetrics & Gynecology

## 2017-04-16 ENCOUNTER — Other Ambulatory Visit: Payer: Self-pay | Admitting: Obstetrics & Gynecology

## 2017-04-16 MED ORDER — ESTRADIOL 10 MCG VA TABS
ORAL_TABLET | VAGINAL | 1 refills | Status: DC
Start: 2017-04-16 — End: 2017-09-25

## 2017-04-24 ENCOUNTER — Ambulatory Visit (INDEPENDENT_AMBULATORY_CARE_PROVIDER_SITE_OTHER): Payer: PPO | Admitting: Family Medicine

## 2017-04-24 ENCOUNTER — Encounter: Payer: Self-pay | Admitting: Family Medicine

## 2017-04-24 VITALS — BP 106/70 | HR 75 | Temp 97.8°F | Ht 59.25 in | Wt 119.4 lb

## 2017-04-24 DIAGNOSIS — J0100 Acute maxillary sinusitis, unspecified: Secondary | ICD-10-CM | POA: Diagnosis not present

## 2017-04-24 MED ORDER — AMOXICILLIN-POT CLAVULANATE 875-125 MG PO TABS: 1.0000 | ORAL_TABLET | Freq: Two times a day (BID) | ORAL | 0 refills | Status: DC

## 2017-04-24 MED ORDER — BENZONATATE 200 MG PO CAPS: 200.0000 mg | ORAL_CAPSULE | Freq: Two times a day (BID) | ORAL | 0 refills | Status: DC | PRN

## 2017-04-24 NOTE — Patient Instructions (Signed)
Rest, hydrate.  + flonase, mucinex (DM if cough), nettie pot or nasal saline.  Augmentin prescribed, take until completed.  Tessalon perles for cough If cough present it can last up to 6-8 weeks.  F/U 2 weeks of not improved.    Sinusitis, Adult Sinusitis is soreness and inflammation of your sinuses. Sinuses are hollow spaces in the bones around your face. They are located:  Around your eyes.  In the middle of your forehead.  Behind your nose.  In your cheekbones.  Your sinuses and nasal passages are lined with a stringy fluid (mucus). Mucus normally drains out of your sinuses. When your nasal tissues get inflamed or swollen, the mucus can get trapped or blocked so air cannot flow through your sinuses. This lets bacteria, viruses, and funguses grow, and that leads to infection. Follow these instructions at home: Medicines  Take, use, or apply over-the-counter and prescription medicines only as told by your doctor. These may include nasal sprays.  If you were prescribed an antibiotic medicine, take it as told by your doctor. Do not stop taking the antibiotic even if you start to feel better. Hydrate and Humidify  Drink enough water to keep your pee (urine) clear or pale yellow.  Use a cool mist humidifier to keep the humidity level in your home above 50%.  Breathe in steam for 10-15 minutes, 3-4 times a day or as told by your doctor. You can do this in the bathroom while a hot shower is running.  Try not to spend time in cool or dry air. Rest  Rest as much as possible.  Sleep with your head raised (elevated).  Make sure to get enough sleep each night. General instructions  Put a warm, moist washcloth on your face 3-4 times a day or as told by your doctor. This will help with discomfort.  Wash your hands often with soap and water. If there is no soap and water, use hand sanitizer.  Do not smoke. Avoid being around people who are smoking (secondhand smoke).  Keep all  follow-up visits as told by your doctor. This is important. Contact a doctor if:  You have a fever.  Your symptoms get worse.  Your symptoms do not get better within 10 days. Get help right away if:  You have a very bad headache.  You cannot stop throwing up (vomiting).  You have pain or swelling around your face or eyes.  You have trouble seeing.  You feel confused.  Your neck is stiff.  You have trouble breathing. This information is not intended to replace advice given to you by your health care provider. Make sure you discuss any questions you have with your health care provider. Document Released: 06/29/2007 Document Revised: 09/06/2015 Document Reviewed: 11/05/2014 Elsevier Interactive Patient Education  [employer].

## 2017-04-24 NOTE — Progress Notes (Signed)
Nichole [name] , 06-Sep-1950, 67 y.o., female MRN: [id number] Patient Care Team    Relationship Specialty Notifications Start End  Ma Hillock, DO PCP - General Family Medicine  12/24/14   Suella Broad, MD Consulting Physician Physical Medicine and Rehabilitation  12/31/15    Comment: pain mgmt  Megan Salon, MD Consulting Physician Gynecology  12/31/15   Richmond Campbell, MD Consulting Physician Gastroenterology  12/31/15   Wallene Huh, [place] Consulting Physician Podiatry  01/10/17   Rolm Bookbinder, MD Consulting Physician Dermatology  01/10/17   Ralene Bathe, MD Consulting Physician Ophthalmology  01/10/17   Toribio Harbour, [place] Referring Physician Chiropractic Medicine  01/10/17     Chief Complaint  Patient presents with  . URI    Pt c/o cough, headache and facial pain X 5 days.     Subjective: Pt presents for an OV with complaints of cough, headache and facial pain of 5 days duration.  Associated symptoms include right sided ear discomfort, sore throat, fatigue. She denies fever, chills, nausea or vomit.   Pt has tried advil cold and sinus to ease their symptoms.   Depression screen [facility] 2/9 01/10/2017 12/31/2015  Decreased Interest 0 1  Down, Depressed, Hopeless 1 3  PHQ - 2 Score 1 4  Altered sleeping 1 1  Tired, decreased energy 2 2  Change in appetite 1 0  Feeling bad or failure about yourself  1 2  Trouble concentrating 0 0  Moving slowly or fidgety/restless 0 0  Suicidal thoughts 0 0  PHQ-9 Score 6 9  Difficult doing work/chores Not difficult at all Somewhat difficult    Allergies  Allergen Reactions  . Codeine     Crawling skin , weird twilight dreams  . Tramadol     Itching    Social History   Tobacco Use  . Smoking status: Never Smoker  . Smokeless tobacco: Never Used  Substance Use Topics  . Alcohol use: No   Past Medical History:  Diagnosis Date  . Anxiety   . Asthma   . Basal cell carcinoma   . Capsulitis of foot 05/02/2012   acute  capsulitis 2nd mpj bilateral .  . Chronic headaches   . Colon polyp    tubular adenoma  . Constipation   . Depression (emotion)   . Elevated hemoglobin A1c   . Fatigue   . Frequent headaches   . Hyperlipidemia   . Hyponatremia    2012  . IBS (irritable bowel syndrome)   . Insomnia   . Neuropathy    fingers/feet  . Osteoarthritis   . Osteopenia   . Raynaud's disease /phenomenon   . Rosacea   . Tinnitus aurium, bilateral    Past Surgical History:  Procedure Laterality Date  . BREAST REDUCTION SURGERY N/A    complications of blood supply  . BUNIONECTOMY Bilateral   . CARDIAC CATHETERIZATION     06/2005  . DENTAL SURGERY    . DIGIT NAIL REMOVAL Right    fingernail removed 4th  . FACIAL COSMETIC SURGERY    . VAGINAL HYSTERECTOMY N/A    Family History  Problem Relation Age of Onset  . Dementia Mother   . Cancer Father   . Hypertension Father   . Fibromyalgia Sister   . Heart attack Maternal Aunt   . Heart attack Maternal Uncle   . Cancer Paternal Aunt   . Breast cancer Paternal Aunt   . Hypertension Sister   . Breast cancer  Cousin   . Heart attack Maternal Uncle   . Rheum arthritis Paternal Grandmother    Allergies as of 04/24/2017      Reactions   Codeine    Crawling skin , weird twilight dreams   Tramadol    Itching      Medication List        Accurate as of 04/24/17  3:54 PM. Always use your most recent med list.          amLODipine 2.5 MG tablet Commonly known as:  NORVASC Take 1 tablet (2.5 mg total) by mouth daily. (For Raynauds )   aspirin 500 MG tablet Take 500 mg by mouth every 6 (six) hours as needed for pain.   buPROPion 150 MG 12 hr tablet Commonly known as:  WELLBUTRIN SR Take 1 tablet (150 mg total) by mouth 2 (two) times daily.   CALCIUM 1200 PO Take 600 mg by mouth.   Estradiol 10 MCG Tabs vaginal tablet 1 tab per vagina twice weekly   FINACEA 15 % cream Generic drug:  Azelaic Acid   fluticasone 50 MCG/ACT nasal  spray Commonly known as:  FLONASE Place 2 sprays into both nostrils daily.   gabapentin 300 MG capsule Commonly known as:  NEURONTIN Take 1 capsule (300 mg total) 3 (three) times daily by mouth.   HYDROmorphone 2 MG tablet Commonly known as:  DILAUDID   lidocaine 5 % Commonly known as:  LIDODERM Place 1 patch onto the skin.   loratadine 10 MG tablet Commonly known as:  CLARITIN Take 10 mg by mouth daily. Pt breaks tablet into fourths and only takes one fourth.   methocarbamol 750 MG tablet Commonly known as:  ROBAXIN Take 750 mg by mouth every 6 (six) hours as needed for muscle spasms.   multivitamin tablet Take 1 tablet by mouth daily.   NON FORMULARY Nerve Renew   Omega 3 1000 MG Caps Take by mouth.   rosuvastatin 10 MG tablet Commonly known as:  CRESTOR Take 1 tablet (10 mg total) by mouth daily.   VITAMIN E PO Take by mouth.   zolpidem 10 MG tablet Commonly known as:  AMBIEN 1/2 tab nightly prn       All past medical history, surgical history, allergies, family history, immunizations andmedications were updated in the EMR today and reviewed under the history and medication portions of their EMR.     ROS: Negative, with the exception of above mentioned in HPI   Objective:  BP 106/70 (BP Location: Left Arm, Patient Position: Sitting, Cuff Size: Normal)   Pulse 75   Temp 97.8 F (36.6 C) (Oral)   Ht 4' 11.25" (1.505 m)   Wt 119 lb 6.4 oz (54.2 kg)   SpO2 100%   BMI 23.91 kg/m  Body mass index is 23.91 kg/m. Gen: Afebrile. No acute distress. Nontoxic in appearance, well developed, well nourished.  HENT: AT. [place]. Bilateral TM visualized with mild fullness right, no erythema. MMM, no oral lesions. Bilateral nares with swelling, erythema and drainage. Throat without erythema or exudates. Cough, hoarseness and TTP right max sinus.  Eyes:Pupils Equal Round Reactive to light, Extraocular movements intact,  Conjunctiva without redness, discharge or  icterus. Neck/lymp/endocrine: Supple,no lymphadenopathy CV: RRR  Chest: CTAB, no wheeze or crackles.  Abd: Soft. NTND. BS present.  Neuro: Normal gait. PERLA. EOMi. Alert. Oriented x3  Psych: Normal affect, dress and demeanor. Normal speech. Normal thought content and judgment.  No exam data present No results found. No  results found for this or any previous visit (from the past 24 hour(s)).  Assessment/Plan: Nichole [name] is a 67 y.o. female present for OV for  Acute non-recurrent maxillary sinusitis Rest, hydrate.  + flonase, mucinex (DM if cough), nettie pot or nasal saline.  Augmentin prescribed, take until completed.  If cough present it can last up to 6-8 weeks.  F/U 2 weeks of not improved.    Reviewed expectations re: course of current medical issues.  Discussed self-management of symptoms.  Outlined signs and symptoms indicating need for more acute intervention.  Patient verbalized understanding and all questions were answered.  Patient received an After-Visit Summary.    No orders of the defined types were placed in this encounter.    Note is dictated utilizing voice recognition software. Although note has been proof read prior to signing, occasional typographical errors still can be missed. If any questions arise, please do not hesitate to call for verification.   electronically signed by:  Howard Pouch, DO  [place]

## 2017-04-24 NOTE — Progress Notes (Signed)
A user error has taken place: encounter opened in error, closed for administrative reasons.

## 2017-04-28 ENCOUNTER — Ambulatory Visit (INDEPENDENT_AMBULATORY_CARE_PROVIDER_SITE_OTHER): Payer: PPO | Admitting: Family Medicine

## 2017-04-28 ENCOUNTER — Encounter: Payer: Self-pay | Admitting: Family Medicine

## 2017-04-28 ENCOUNTER — Ambulatory Visit: Payer: Self-pay | Admitting: *Deleted

## 2017-04-28 VITALS — BP 131/72 | HR 59 | Temp 97.7°F | Resp 16 | Wt 119.0 lb

## 2017-04-28 DIAGNOSIS — J301 Allergic rhinitis due to pollen: Secondary | ICD-10-CM

## 2017-04-28 DIAGNOSIS — J0100 Acute maxillary sinusitis, unspecified: Secondary | ICD-10-CM | POA: Diagnosis not present

## 2017-04-28 DIAGNOSIS — H6983 Other specified disorders of Eustachian tube, bilateral: Secondary | ICD-10-CM

## 2017-04-28 MED ORDER — PREDNISONE 20 MG PO TABS: ORAL_TABLET | ORAL | 0 refills | Status: DC

## 2017-04-28 NOTE — Progress Notes (Signed)
OFFICE VISIT  04/28/2017   CC:  Chief Complaint  Patient presents with  . Sinusitis    HPI:    Patient is a 67 y.o.  female who presents for "sinusitis". Has had nasal congestion for 1 week, dx'd by Dr. Raoul Pitch with acute sinusitis on 04/24/17, augmentin rx'd. Patient returns today b/c although her nasal congestion has improved, she is still having HA and some "roaring in her ears", also feels like she has disequilibrium.  Cough cleared up for the most part.  No fevers.   Compliant with augmentin, no side effects.  Took ibup 400 mg about 1 hour ago.  Takes tylenol, too.  Has not used any flonase. Using saline nasal spray.  Past Medical History:  Diagnosis Date  . Anxiety   . Asthma   . Basal cell carcinoma   . Capsulitis of foot 05/02/2012   acute capsulitis 2nd mpj bilateral .  . Chronic headaches   . Colon polyp    tubular adenoma  . Constipation   . Depression (emotion)   . Elevated hemoglobin A1c   . Fatigue   . Frequent headaches   . Hyperlipidemia   . Hyponatremia    2012  . IBS (irritable bowel syndrome)   . Insomnia   . Neuropathy    fingers/feet  . Osteoarthritis   . Osteopenia   . Raynaud's disease /phenomenon   . Rosacea   . Tinnitus aurium, bilateral     Past Surgical History:  Procedure Laterality Date  . BREAST REDUCTION SURGERY N/A    complications of blood supply  . BUNIONECTOMY Bilateral   . CARDIAC CATHETERIZATION     06/2005  . DENTAL SURGERY    . DIGIT NAIL REMOVAL Right    fingernail removed 4th  . FACIAL COSMETIC SURGERY    . VAGINAL HYSTERECTOMY N/A     Outpatient Medications Prior to Visit  Medication Sig Dispense Refill  . amLODipine (NORVASC) 2.5 MG tablet Take 1 tablet (2.5 mg total) by mouth daily. (For Raynauds ) 90 tablet 3  . amoxicillin-clavulanate (AUGMENTIN) 875-125 MG tablet Take 1 tablet by mouth 2 (two) times daily. 20 tablet 0  . aspirin 500 MG tablet Take 500 mg by mouth every 6 (six) hours as needed for pain.    .  benzonatate (TESSALON) 200 MG capsule Take 1 capsule (200 mg total) by mouth 2 (two) times daily as needed for cough. 20 capsule 0  . buPROPion (WELLBUTRIN SR) 150 MG 12 hr tablet Take 1 tablet (150 mg total) by mouth 2 (two) times daily. 180 tablet 4  . Calcium Carbonate-Vit D-Min (CALCIUM 1200 PO) Take 600 mg by mouth.    . Estradiol 10 MCG TABS vaginal tablet 1 tab per vagina twice weekly 24 tablet 1  . FINACEA 15 % cream     . fluticasone (FLONASE) 50 MCG/ACT nasal spray Place 2 sprays into both nostrils daily. 16 g 2  . gabapentin (NEURONTIN) 300 MG capsule Take 1 capsule (300 mg total) 3 (three) times daily by mouth. (Patient taking differently: Take 300 mg by mouth at bedtime. ) 90 capsule 3  . HYDROmorphone (DILAUDID) 2 MG tablet   0  . lidocaine (LIDODERM) 5 % Place 1 patch onto the skin.    Marland Kitchen loratadine (CLARITIN) 10 MG tablet Take 10 mg by mouth daily. Pt breaks tablet into fourths and only takes one fourth.    . methocarbamol (ROBAXIN) 750 MG tablet Take 750 mg by mouth every 6 (six) hours  as needed for muscle spasms.    . Multiple Vitamin (MULTIVITAMIN) tablet Take 1 tablet by mouth daily.    . NON FORMULARY Nerve Renew    . Omega 3 1000 MG CAPS Take by mouth.    . rosuvastatin (CRESTOR) 10 MG tablet Take 1 tablet (10 mg total) by mouth daily. 90 tablet 3  . VITAMIN E PO Take by mouth.    . zolpidem (AMBIEN) 10 MG tablet 1/2 tab nightly prn 30 tablet 3   No facility-administered medications prior to visit.     Allergies  Allergen Reactions  . Codeine     Crawling skin , weird twilight dreams  . Tramadol     Itching     ROS As per HPI  PE: Blood pressure 131/72, pulse (!) 59, temperature 97.7 F (36.5 C), temperature source Oral, resp. rate 16, weight 119 lb (54 kg), SpO2 100 %. VS: noted--normal. Gen: alert, NAD, NONTOXIC APPEARING. HEENT: eyes without injection, drainage, or swelling.  Ears: EACs clear, TMs with normal light reflex and landmarks.  Nose: Clear  rhinorrhea, with some dried, crusty exudate adherent to mildly injected mucosa.  No purulent d/c.  No paranasal sinus TTP.  No facial swelling.  Throat and mouth without focal lesion.  No pharyngial swelling, erythema, or exudate.   Neck: supple, no LAD.   LUNGS: CTA bilat, nonlabored resps.   CV: RRR, no m/r/g. EXT: no c/c/e SKIN: no rash   LABS:    Chemistry      Component Value Date/Time   NA 141 01/10/2017 0921   K 4.2 01/10/2017 0921   CL 103 01/10/2017 0921   CO2 31 01/10/2017 0921   BUN 10 01/10/2017 0921   CREATININE 0.85 01/10/2017 0921   CREATININE 0.81 12/31/2015 1013      Component Value Date/Time   CALCIUM 9.9 01/10/2017 0921   ALKPHOS 58 01/10/2017 0921   AST 24 01/10/2017 0921   ALT 25 01/10/2017 0921   BILITOT 0.4 01/10/2017 0921     Lab Results  Component Value Date   WBC 6.2 01/10/2017   HGB 13.5 01/10/2017   HCT 40.9 01/10/2017   MCV 94.9 01/10/2017   PLT 313.0 01/10/2017     IMPRESSION AND PLAN:  Acute sinusitis: infectious + allergic.  Significant eustachian tube dysfunction bilaterally. Continue augmentin and I'll add prednisone 40mg  qd x 5d, then 20mg  qd x 5d. Start flonase 2 sprays each nostril qd.  Continue saline nasal spray.  Continue claritin. Continue ibup 400mg  q6h prn pain.  An After Visit Summary was printed and given to the patient.  FOLLOW UP: Return if symptoms worsen or fail to improve.  Signed:  Crissie Sickles, MD           04/28/2017

## 2017-04-28 NOTE — Telephone Encounter (Signed)
Saw pt this afternoon.

## 2017-04-28 NOTE — Telephone Encounter (Signed)
  Reason for Disposition . [1] Taking antibiotic > 72 hours (3 days) AND [2] sinus pain not improved  Answer Assessment - Initial Assessment Questions Seen on Monday for sinusitis by Dr. Raoul Pitch. She continues to have facial sinus pressure and mild headache. Ears are buzzing and popping. Wednesday, she began to feel as it she was leaning to the left when she walks, this is intermittent. Denies any vertigo at this time.    1. ANTIBIOTIC: "What antibiotic are you receiving?" "How many times per day?"     Started Amoxicillin-CLAV Monday evening. 2. ONSET: "When was the antibiotic started?"   Monday 3. PAIN: "How bad is the sinus pain?"   (Scale 1-10; mild, moderate or severe)   - MILD (1-3): doesn't interfere with normal activities    - MODERATE (4-7): interferes with normal activities (e.g., work or school) or awakens from sleep   - SEVERE (8-10): excruciating pain and patient unable to do any normal activities    Moderate-5 on the scale 4. FEVER: "Do you have a fever?" If so, ask: "What is it, how was it measured, and when did it start?"    no 5. SYMPTOMS: "Are there any other symptoms you're concerned about?" If so, ask: "When did it start?"     Nose feels clogged, not stopped up. Ears are buzzing constantly, Facial sinus pressure. Headache-mild. Loss of appetite. 6. PREGNANCY: "Is there any chance you are pregnant?" "When was your last menstrual period?"    no  Protocols used: SINUS INFECTION ON ANTIBIOTIC FOLLOW-UP CALL-A-AH

## 2017-04-28 NOTE — Telephone Encounter (Signed)
Scheduled to see Dr. Anitra Lauth today at 2:30pm.

## 2017-05-04 DIAGNOSIS — M51369 Other intervertebral disc degeneration, lumbar region without mention of lumbar back pain or lower extremity pain: Secondary | ICD-10-CM | POA: Insufficient documentation

## 2017-05-04 DIAGNOSIS — M5136 Other intervertebral disc degeneration, lumbar region: Secondary | ICD-10-CM | POA: Insufficient documentation

## 2017-05-09 ENCOUNTER — Ambulatory Visit (INDEPENDENT_AMBULATORY_CARE_PROVIDER_SITE_OTHER): Payer: PPO | Admitting: Family Medicine

## 2017-05-09 ENCOUNTER — Encounter: Payer: Self-pay | Admitting: Family Medicine

## 2017-05-09 VITALS — BP 126/70 | HR 75 | Temp 98.1°F | Resp 20 | Ht 59.0 in | Wt 118.5 lb

## 2017-05-09 DIAGNOSIS — J0101 Acute recurrent maxillary sinusitis: Secondary | ICD-10-CM | POA: Diagnosis not present

## 2017-05-09 MED ORDER — LEVOFLOXACIN 500 MG PO TABS: 500.0000 mg | ORAL_TABLET | Freq: Every day | ORAL | 0 refills | Status: DC

## 2017-05-09 MED ORDER — NYSTATIN 100000 UNIT/ML MT SUSP
5.0000 mL | Freq: Four times a day (QID) | OROMUCOSAL | 0 refills | Status: DC
Start: 2017-05-09 — End: 2017-08-21

## 2017-05-09 NOTE — Patient Instructions (Addendum)
Rest, hydrate.  + flonase, mucinex (DM if cough), nettie pot or nasal saline.  Flush with nasal saline daily, and before Flonase use.  levaquin prescribed, take until completed.  Stop claritin, start zyrtec F/U 2 weeks of not improved.   HYDRATE!!!  Nystatin swish also prescribed  I think you just had a resistant sinus infection. This medication should knock it out. If symptoms are not better in 2 weeks we will need to consider image of sinus.

## 2017-05-09 NOTE — Progress Notes (Signed)
Nichole [name] , 1950/04/17, 67 y.o., female MRN: [id number] Patient Care Team    Relationship Specialty Notifications Start End  Ma Hillock, DO PCP - General Family Medicine  12/24/14   Suella Broad, MD Consulting Physician Physical Medicine and Rehabilitation  12/31/15    Comment: pain mgmt  Megan Salon, MD Consulting Physician Gynecology  12/31/15   Richmond Campbell, MD Consulting Physician Gastroenterology  12/31/15   Wallene Huh, DPM Consulting Physician Podiatry  01/10/17   Rolm Bookbinder, MD Consulting Physician Dermatology  01/10/17   Ralene Bathe, MD Consulting Physician Ophthalmology  01/10/17   Toribio Harbour, [place] Referring Physician Chiropractic Medicine  01/10/17     Chief Complaint  Patient presents with  . tongue soreness  . Facial Pain    and top of head     Subjective: Pt presents for an OV with complaints of  headache and facial pain. She was seen about 3 weeks ago and treated for sinus infection with Augmentin. She then returned a few days after start with dizziness/vertigo and had fullness of bilateral ears and placed on steroid taper. She returns today and states she has burning on her tongue (tip mostly) and she is concerned for thrush. She also still has mild dizziness, "tinglin gin her head", facial pressure and mild headache. She had stopped Flonase yesterday. She stopped mucinex DM and start Claritin D.   Associated symptoms include right sided ear discomfort, sore throat, fatigue. She denies fever, chills, nausea or vomit.  She states she always has ringing her ears, but feels it is worse right now. Pt has tried advil cold and sinus to ease their symptoms.   Depression screen [facility] 2/9 01/10/2017 12/31/2015  Decreased Interest 0 1  Down, Depressed, Hopeless 1 3  PHQ - 2 Score 1 4  Altered sleeping 1 1  Tired, decreased energy 2 2  Change in appetite 1 0  Feeling bad or failure about yourself  1 2  Trouble concentrating 0 0  Moving slowly or  fidgety/restless 0 0  Suicidal thoughts 0 0  PHQ-9 Score 6 9  Difficult doing work/chores Not difficult at all Somewhat difficult    Allergies  Allergen Reactions  . Codeine     Crawling skin , weird twilight dreams  . Tramadol     Itching    Social History   Tobacco Use  . Smoking status: Never Smoker  . Smokeless tobacco: Never Used  Substance Use Topics  . Alcohol use: No   Past Medical History:  Diagnosis Date  . Anxiety   . Asthma   . Basal cell carcinoma   . Capsulitis of foot 05/02/2012   acute capsulitis 2nd mpj bilateral .  . Chronic headaches   . Colon polyp    tubular adenoma  . Constipation   . Depression (emotion)   . Elevated hemoglobin A1c   . Fatigue   . Frequent headaches   . Hyperlipidemia   . Hyponatremia    2012  . IBS (irritable bowel syndrome)   . Insomnia   . Neuropathy    fingers/feet  . Osteoarthritis   . Osteopenia   . Raynaud's disease /phenomenon   . Rosacea   . Tinnitus aurium, bilateral    Past Surgical History:  Procedure Laterality Date  . BREAST REDUCTION SURGERY N/A    complications of blood supply  . BUNIONECTOMY Bilateral   . CARDIAC CATHETERIZATION     06/2005  . DENTAL SURGERY    .  DIGIT NAIL REMOVAL Right    fingernail removed 4th  . FACIAL COSMETIC SURGERY    . VAGINAL HYSTERECTOMY N/A    Family History  Problem Relation Age of Onset  . Dementia Mother   . Cancer Father   . Hypertension Father   . Fibromyalgia Sister   . Heart attack Maternal Aunt   . Heart attack Maternal Uncle   . Cancer Paternal Aunt   . Breast cancer Paternal Aunt   . Hypertension Sister   . Breast cancer Cousin   . Heart attack Maternal Uncle   . Rheum arthritis Paternal Grandmother    Allergies as of 05/09/2017      Reactions   Codeine    Crawling skin , weird twilight dreams   Tramadol    Itching      Medication List        Accurate as of 05/09/17  1:29 PM. Always use your most recent med list.          amLODipine  2.5 MG tablet Commonly known as:  NORVASC Take 1 tablet (2.5 mg total) by mouth daily. (For Raynauds )   aspirin 500 MG tablet Take 500 mg by mouth every 6 (six) hours as needed for pain.   buPROPion 150 MG 12 hr tablet Commonly known as:  WELLBUTRIN SR Take 1 tablet (150 mg total) by mouth 2 (two) times daily.   CALCIUM 1200 PO Take 600 mg by mouth.   Estradiol 10 MCG Tabs vaginal tablet 1 tab per vagina twice weekly   FINACEA 15 % cream Generic drug:  Azelaic Acid   fluticasone 50 MCG/ACT nasal spray Commonly known as:  FLONASE Place 2 sprays into both nostrils daily.   gabapentin 300 MG capsule Commonly known as:  NEURONTIN Take 1 capsule (300 mg total) 3 (three) times daily by mouth.   HYDROmorphone 2 MG tablet Commonly known as:  DILAUDID   lidocaine 5 % Commonly known as:  LIDODERM Place 1 patch onto the skin.   loratadine 10 MG tablet Commonly known as:  CLARITIN Take 10 mg by mouth daily. Pt breaks tablet into fourths and only takes one fourth.   methocarbamol 750 MG tablet Commonly known as:  ROBAXIN Take 750 mg by mouth every 6 (six) hours as needed for muscle spasms.   multivitamin tablet Take 1 tablet by mouth daily.   NON FORMULARY Nerve Renew   Omega 3 1000 MG Caps Take by mouth.   rosuvastatin 10 MG tablet Commonly known as:  CRESTOR Take 1 tablet (10 mg total) by mouth daily.   VITAMIN E PO Take by mouth.   zolpidem 10 MG tablet Commonly known as:  AMBIEN 1/2 tab nightly prn       All past medical history, surgical history, allergies, family history, immunizations andmedications were updated in the EMR today and reviewed under the history and medication portions of their EMR.     ROS: Negative, with the exception of above mentioned in HPI   Objective:  BP 126/70 (BP Location: Right Arm, Patient Position: Sitting, Cuff Size: Normal)   Pulse 75   Temp 98.1 F (36.7 C)   Resp 20   Ht 4\' 11"  (1.499 m)   Wt 118 lb 8 oz (53.8 kg)    SpO2 97%   BMI 23.93 kg/m  Body mass index is 23.93 kg/m.  Gen: Afebrile. No acute distress. Nontoxic in appearance, well developed.  HENT: AT. . Bilateral TM visualized and normal in appearance. MMM.  Bilateral nares with significant erythema and swelling remaining R>L. Throat without erythema or exudates. Mild cough, no hoarseness. Mild TTP right max sinus.  Eyes:Pupils Equal Round Reactive to light, Extraocular movements intact,  Conjunctiva without redness, discharge or icterus. Neck/lymp/endocrine: Supple,no lymphadenopathy CV: RRR  Chest: CTAB, no wheeze or crackles Neuro: Normal gait. PERLA. EOMi. Alert. Oriented x3 .   No exam data present No results found. No results found for this or any previous visit (from the past 24 hour(s)).  Assessment/Plan: BILLY ROCCO is a 67 y.o. female present for OV for  Acute resistent maxillary sinusitis Rest, hydrate.  + flonase DAILY, mucinex (DM if cough), nasal saline TID DC Claritin D, start zyrtec LQ x 7d prescribed, take until completed.   F/U 2 weeks of not improved--> would obtain image.    Reviewed expectations re: course of current medical issues.  Discussed self-management of symptoms.  Outlined signs and symptoms indicating need for more acute intervention.  Patient verbalized understanding and all questions were answered.  Patient received an After-Visit Summary.    No orders of the defined types were placed in this encounter.    Note is dictated utilizing voice recognition software. Although note has been proof read prior to signing, occasional typographical errors still can be missed. If any questions arise, please do not hesitate to call for verification.   electronically signed by:  Howard Pouch, DO  [place]

## 2017-05-17 DIAGNOSIS — M545 Low back pain: Secondary | ICD-10-CM | POA: Diagnosis not present

## 2017-05-17 DIAGNOSIS — Z79891 Long term (current) use of opiate analgesic: Secondary | ICD-10-CM | POA: Diagnosis not present

## 2017-05-17 DIAGNOSIS — G894 Chronic pain syndrome: Secondary | ICD-10-CM | POA: Diagnosis not present

## 2017-05-17 DIAGNOSIS — M542 Cervicalgia: Secondary | ICD-10-CM | POA: Diagnosis not present

## 2017-05-23 DIAGNOSIS — M5136 Other intervertebral disc degeneration, lumbar region: Secondary | ICD-10-CM | POA: Diagnosis not present

## 2017-06-06 DIAGNOSIS — M47812 Spondylosis without myelopathy or radiculopathy, cervical region: Secondary | ICD-10-CM | POA: Diagnosis not present

## 2017-06-30 DIAGNOSIS — L570 Actinic keratosis: Secondary | ICD-10-CM | POA: Diagnosis not present

## 2017-06-30 DIAGNOSIS — Z85828 Personal history of other malignant neoplasm of skin: Secondary | ICD-10-CM | POA: Diagnosis not present

## 2017-07-17 ENCOUNTER — Telehealth: Payer: Self-pay | Admitting: *Deleted

## 2017-07-17 ENCOUNTER — Other Ambulatory Visit: Payer: Self-pay | Admitting: Obstetrics & Gynecology

## 2017-07-17 ENCOUNTER — Other Ambulatory Visit: Payer: Self-pay | Admitting: Podiatry

## 2017-07-17 MED ORDER — GABAPENTIN 300 MG PO CAPS: 300.0000 mg | ORAL_CAPSULE | Freq: Three times a day (TID) | ORAL | 3 refills | Status: DC

## 2017-07-17 NOTE — Telephone Encounter (Signed)
Left voice mail message for patient at [phone] to let them know their prescription for Gabapentin 300 mg, Dispensed 90 with 3 refills was sent to their [facility] off of [address] in [place], [place].

## 2017-07-18 NOTE — Telephone Encounter (Signed)
Medication refill request: Wellbutrin  Last AEX:  02-17-17  Next AEX: 04-26-18 Last MMG (if hormonal medication request): 07-19-16 WNL  Refill authorized: please advise

## 2017-08-01 DIAGNOSIS — Z1231 Encounter for screening mammogram for malignant neoplasm of breast: Secondary | ICD-10-CM | POA: Diagnosis not present

## 2017-08-01 LAB — HM MAMMOGRAPHY

## 2017-08-02 ENCOUNTER — Encounter: Payer: Self-pay | Admitting: Family Medicine

## 2017-08-02 ENCOUNTER — Telehealth: Payer: Self-pay | Admitting: Family Medicine

## 2017-08-02 NOTE — Telephone Encounter (Signed)
Please inform patient the following information: Mammogram is negative

## 2017-08-03 NOTE — Telephone Encounter (Signed)
Patient aware, verbalized understanding.

## 2017-08-18 ENCOUNTER — Encounter: Payer: Self-pay | Admitting: *Deleted

## 2017-08-21 ENCOUNTER — Encounter: Payer: Self-pay | Admitting: Family Medicine

## 2017-08-21 ENCOUNTER — Ambulatory Visit (INDEPENDENT_AMBULATORY_CARE_PROVIDER_SITE_OTHER): Payer: PPO | Admitting: Family Medicine

## 2017-08-21 VITALS — BP 104/66 | HR 69 | Temp 97.9°F | Resp 20 | Ht 59.75 in | Wt 121.5 lb

## 2017-08-21 DIAGNOSIS — F5101 Primary insomnia: Secondary | ICD-10-CM | POA: Diagnosis not present

## 2017-08-21 MED ORDER — ZOLPIDEM TARTRATE 5 MG PO TABS: 2.5000 mg | ORAL_TABLET | Freq: Every evening | ORAL | 5 refills | Status: DC | PRN

## 2017-08-21 NOTE — Progress Notes (Signed)
Nichole [name] , 1951/01/12, 67 y.o., female MRN: [id number] Patient Care Team    Relationship Specialty Notifications Start End  Ma Hillock, DO PCP - General Family Medicine  12/24/14   Suella Broad, MD Consulting Physician Physical Medicine and Rehabilitation  12/31/15    Comment: pain mgmt  Megan Salon, MD Consulting Physician Gynecology  12/31/15   Richmond Campbell, MD Consulting Physician Gastroenterology  12/31/15   Wallene Huh, [place] Consulting Physician Podiatry  01/10/17   Rolm Bookbinder, MD Consulting Physician Dermatology  01/10/17   Ralene Bathe, MD Consulting Physician Ophthalmology  01/10/17   Toribio Harbour, [place] Referring Physician Chiropractic Medicine  01/10/17     Chief Complaint  Patient presents with  . Insomnia     Subjective: Pt presents for an OV with complaints of insomnia. This is a chronic issue for her. S he has been prescribed ambien in which she takes about 1/3 of a tablet or less (ambien 10 mg). She wants the 10 mg tablet. She denies over sedation and she is on other controlled substances by other doctors.   Depression screen [facility] 2/9 01/10/2017 12/31/2015  Decreased Interest 0 1  Down, Depressed, Hopeless 1 3  PHQ - 2 Score 1 4  Altered sleeping 1 1  Tired, decreased energy 2 2  Change in appetite 1 0  Feeling bad or failure about yourself  1 2  Trouble concentrating 0 0  Moving slowly or fidgety/restless 0 0  Suicidal thoughts 0 0  PHQ-9 Score 6 9  Difficult doing work/chores Not difficult at all Somewhat difficult    Allergies  Allergen Reactions  . Codeine     Crawling skin , weird twilight dreams  . Tramadol     Itching    Social History   Tobacco Use  . Smoking status: Never Smoker  . Smokeless tobacco: Never Used  Substance Use Topics  . Alcohol use: No   Past Medical History:  Diagnosis Date  . Anxiety   . Asthma   . Basal cell carcinoma   . Capsulitis of foot 05/02/2012   acute capsulitis 2nd mpj bilateral .    . Chronic headaches   . Colon polyp    tubular adenoma  . Constipation   . Depression (emotion)   . Elevated hemoglobin A1c   . Fatigue   . Frequent headaches   . Hyperlipidemia   . Hyponatremia    2012  . IBS (irritable bowel syndrome)   . Insomnia   . Neuropathy    fingers/feet  . Osteoarthritis   . Osteopenia   . Raynaud's disease /phenomenon   . Rosacea   . Tinnitus aurium, bilateral    Past Surgical History:  Procedure Laterality Date  . BREAST REDUCTION SURGERY N/A    complications of blood supply  . BUNIONECTOMY Bilateral   . CARDIAC CATHETERIZATION     06/2005  . DENTAL SURGERY    . DIGIT NAIL REMOVAL Right    fingernail removed 4th  . FACIAL COSMETIC SURGERY    . VAGINAL HYSTERECTOMY N/A    Family History  Problem Relation Age of Onset  . Dementia Mother   . Cancer Father   . Hypertension Father   . Fibromyalgia Sister   . Heart attack Maternal Aunt   . Heart attack Maternal Uncle   . Cancer Paternal Aunt   . Breast cancer Paternal Aunt   . Hypertension Sister   . Breast cancer Cousin   .  Heart attack Maternal Uncle   . Rheum arthritis Paternal Grandmother    Allergies as of 08/21/2017      Reactions   Codeine    Crawling skin , weird twilight dreams   Tramadol    Itching      Medication List        Accurate as of 08/21/17  2:52 PM. Always use your most recent med list.          amLODipine 2.5 MG tablet Commonly known as:  NORVASC Take 1 tablet (2.5 mg total) by mouth daily. (For Raynauds )   aspirin 500 MG tablet Take 500 mg by mouth every 6 (six) hours as needed for pain.   buPROPion 150 MG 12 hr tablet Commonly known as:  WELLBUTRIN SR TAKE 1 TABLET BY MOUTH TWICE A DAY   CALCIUM 1200 PO Take 600 mg by mouth.   Estradiol 10 MCG Tabs vaginal tablet 1 tab per vagina twice weekly   FINACEA 15 % cream Generic drug:  Azelaic Acid   fluticasone 50 MCG/ACT nasal spray Commonly known as:  FLONASE Place 2 sprays into both  nostrils daily.   gabapentin 300 MG capsule Commonly known as:  NEURONTIN Take 1 capsule (300 mg total) 3 (three) times daily by mouth.   HYDROmorphone 2 MG tablet Commonly known as:  DILAUDID   lidocaine 5 % Commonly known as:  LIDODERM Place 1 patch onto the skin.   loratadine 10 MG tablet Commonly known as:  CLARITIN Take 10 mg by mouth daily. Pt breaks tablet into fourths and only takes one fourth.   methocarbamol 750 MG tablet Commonly known as:  ROBAXIN Take 750 mg by mouth every 6 (six) hours as needed for muscle spasms.   multivitamin tablet Take 1 tablet by mouth daily.   NON FORMULARY Nerve Renew   Omega 3 1000 MG Caps Take by mouth.   rosuvastatin 10 MG tablet Commonly known as:  CRESTOR Take 1 tablet (10 mg total) by mouth daily.   VITAMIN B 12 PO Take 1,000 mcg by mouth.   VITAMIN E PO Take by mouth.   zolpidem 10 MG tablet Commonly known as:  AMBIEN 1/2 tab nightly prn       All past medical history, surgical history, allergies, family history, immunizations andmedications were updated in the EMR today and reviewed under the history and medication portions of their EMR.     ROS: Negative, with the exception of above mentioned in HPI   Objective:  BP 104/66 (BP Location: Right Arm, Patient Position: Sitting, Cuff Size: Normal)   Pulse 69   Temp 97.9 F (36.6 C)   Resp 20   Ht 4\' 11"  (1.499 m)   Wt 121 lb 8 oz (55.1 kg)   SpO2 97%   BMI 24.54 kg/m  Body mass index is 24.54 kg/m. Gen: Afebrile. No acute distress. Nontoxic in appearance, well developed, well nourished.  HENT: AT. [place].  MMM Eyes:Pupils Equal Round Reactive to light, Extraocular movements intact,  Conjunctiva without redness, discharge or icterus. Neuro:  Normal gait. PERLA. EOMi. Alert. Oriented x3  Psych: Normal affect, dress and demeanor. Normal speech. Normal thought content and judgment.  No exam data present No results found. No results found for this or any  previous visit (from the past 24 hour(s)).  Assessment/Plan: Nichole [name] is a 67 y.o. female present for OV for  Primary insomnia - contract signed today for Ambien. Discussed dosage is 5 mg, this dose  is mandated by law. She was upset she could not have the 10 mg and break into 3, instead of the 5 mg and break into 2 if needed.  - advised she will need to get script from this provider only.  - She is on other controlled substances supplied by other physicians.  - [place] reviewed today.  - F/U 6 months.    Reviewed expectations re: course of current medical issues.  Discussed self-management of symptoms.  Outlined signs and symptoms indicating need for more acute intervention.  Patient verbalized understanding and all questions were answered.  Patient received an After-Visit Summary.    No orders of the defined types were placed in this encounter.    Note is dictated utilizing voice recognition software. Although note has been proof read prior to signing, occasional typographical errors still can be missed. If any questions arise, please do not hesitate to call for verification.   electronically signed by:  Howard Pouch, DO  [place]

## 2017-08-21 NOTE — Patient Instructions (Signed)
Ambien 5 mg ordered today.  You can take 1/2 to 1 a night.  Enough refills for 6 months. Then face to face encounter needed to refill.

## 2017-09-15 DIAGNOSIS — M5136 Other intervertebral disc degeneration, lumbar region: Secondary | ICD-10-CM | POA: Diagnosis not present

## 2017-09-15 DIAGNOSIS — G894 Chronic pain syndrome: Secondary | ICD-10-CM | POA: Diagnosis not present

## 2017-09-25 ENCOUNTER — Other Ambulatory Visit: Payer: Self-pay | Admitting: Obstetrics & Gynecology

## 2017-09-26 NOTE — Telephone Encounter (Signed)
Medication refill request: Yuvafem  Last AEX:  02/17/17 Next AEX: 04/26/18 Last MMG (if hormonal medication request): Bi-rads  Category 2 begin Refill authorized: Please advise

## 2017-10-13 ENCOUNTER — Encounter: Payer: Self-pay | Admitting: Family Medicine

## 2017-11-16 DIAGNOSIS — D2271 Melanocytic nevi of right lower limb, including hip: Secondary | ICD-10-CM | POA: Diagnosis not present

## 2017-11-16 DIAGNOSIS — I788 Other diseases of capillaries: Secondary | ICD-10-CM | POA: Diagnosis not present

## 2017-11-16 DIAGNOSIS — Z419 Encounter for procedure for purposes other than remedying health state, unspecified: Secondary | ICD-10-CM | POA: Diagnosis not present

## 2017-11-16 DIAGNOSIS — Z85828 Personal history of other malignant neoplasm of skin: Secondary | ICD-10-CM | POA: Diagnosis not present

## 2017-11-16 DIAGNOSIS — D2272 Melanocytic nevi of left lower limb, including hip: Secondary | ICD-10-CM | POA: Diagnosis not present

## 2017-11-16 DIAGNOSIS — L570 Actinic keratosis: Secondary | ICD-10-CM | POA: Diagnosis not present

## 2017-11-16 DIAGNOSIS — D1801 Hemangioma of skin and subcutaneous tissue: Secondary | ICD-10-CM | POA: Diagnosis not present

## 2017-11-16 DIAGNOSIS — L821 Other seborrheic keratosis: Secondary | ICD-10-CM | POA: Diagnosis not present

## 2017-11-16 DIAGNOSIS — L814 Other melanin hyperpigmentation: Secondary | ICD-10-CM | POA: Diagnosis not present

## 2017-11-16 DIAGNOSIS — L298 Other pruritus: Secondary | ICD-10-CM | POA: Diagnosis not present

## 2017-11-16 DIAGNOSIS — D225 Melanocytic nevi of trunk: Secondary | ICD-10-CM | POA: Diagnosis not present

## 2017-12-19 DIAGNOSIS — M5136 Other intervertebral disc degeneration, lumbar region: Secondary | ICD-10-CM | POA: Diagnosis not present

## 2018-01-02 DIAGNOSIS — M5136 Other intervertebral disc degeneration, lumbar region: Secondary | ICD-10-CM | POA: Diagnosis not present

## 2018-01-07 ENCOUNTER — Other Ambulatory Visit: Payer: Self-pay | Admitting: Obstetrics & Gynecology

## 2018-01-09 ENCOUNTER — Encounter: Payer: Self-pay | Admitting: Family Medicine

## 2018-01-09 ENCOUNTER — Other Ambulatory Visit: Payer: Self-pay | Admitting: Podiatry

## 2018-01-09 MED ORDER — GABAPENTIN 300 MG PO CAPS: 300.0000 mg | ORAL_CAPSULE | Freq: Three times a day (TID) | ORAL | 3 refills | Status: DC

## 2018-01-12 ENCOUNTER — Encounter: Payer: PPO | Admitting: Family Medicine

## 2018-01-12 ENCOUNTER — Ambulatory Visit: Payer: PPO

## 2018-01-26 DIAGNOSIS — M5136 Other intervertebral disc degeneration, lumbar region: Secondary | ICD-10-CM | POA: Diagnosis not present

## 2018-01-26 DIAGNOSIS — M5030 Other cervical disc degeneration, unspecified cervical region: Secondary | ICD-10-CM | POA: Insufficient documentation

## 2018-01-26 DIAGNOSIS — M47812 Spondylosis without myelopathy or radiculopathy, cervical region: Secondary | ICD-10-CM | POA: Diagnosis not present

## 2018-01-26 DIAGNOSIS — Z79891 Long term (current) use of opiate analgesic: Secondary | ICD-10-CM | POA: Diagnosis not present

## 2018-01-30 ENCOUNTER — Other Ambulatory Visit: Payer: Self-pay

## 2018-01-30 ENCOUNTER — Encounter: Payer: Self-pay | Admitting: Family Medicine

## 2018-01-30 ENCOUNTER — Ambulatory Visit (INDEPENDENT_AMBULATORY_CARE_PROVIDER_SITE_OTHER): Payer: PPO | Admitting: Family Medicine

## 2018-01-30 VITALS — BP 116/71 | HR 59 | Temp 97.4°F | Resp 16 | Ht 59.0 in | Wt 123.2 lb

## 2018-01-30 DIAGNOSIS — I7300 Raynaud's syndrome without gangrene: Secondary | ICD-10-CM

## 2018-01-30 DIAGNOSIS — E559 Vitamin D deficiency, unspecified: Secondary | ICD-10-CM | POA: Diagnosis not present

## 2018-01-30 DIAGNOSIS — E785 Hyperlipidemia, unspecified: Secondary | ICD-10-CM | POA: Diagnosis not present

## 2018-01-30 DIAGNOSIS — R350 Frequency of micturition: Secondary | ICD-10-CM

## 2018-01-30 DIAGNOSIS — Z0000 Encounter for general adult medical examination without abnormal findings: Secondary | ICD-10-CM | POA: Diagnosis not present

## 2018-01-30 DIAGNOSIS — G479 Sleep disorder, unspecified: Secondary | ICD-10-CM | POA: Diagnosis not present

## 2018-01-30 DIAGNOSIS — F329 Major depressive disorder, single episode, unspecified: Secondary | ICD-10-CM

## 2018-01-30 DIAGNOSIS — F419 Anxiety disorder, unspecified: Secondary | ICD-10-CM | POA: Diagnosis not present

## 2018-01-30 DIAGNOSIS — R7309 Other abnormal glucose: Secondary | ICD-10-CM

## 2018-01-30 DIAGNOSIS — Z130 Encounter for screening for diseases of the blood and blood-forming organs and certain disorders involving the immune mechanism: Secondary | ICD-10-CM | POA: Diagnosis not present

## 2018-01-30 DIAGNOSIS — F32A Depression, unspecified: Secondary | ICD-10-CM

## 2018-01-30 DIAGNOSIS — M5416 Radiculopathy, lumbar region: Secondary | ICD-10-CM | POA: Diagnosis not present

## 2018-01-30 LAB — CBC
HCT: 42.7 % (ref 36.0–46.0)
HEMOGLOBIN: 14.3 g/dL (ref 12.0–15.0)
MCHC: 33.5 g/dL (ref 30.0–36.0)
MCV: 95.1 fl (ref 78.0–100.0)
Platelets: 307 10*3/uL (ref 150.0–400.0)
RBC: 4.5 Mil/uL (ref 3.87–5.11)
RDW: 13.1 % (ref 11.5–15.5)
WBC: 5.1 10*3/uL (ref 4.0–10.5)

## 2018-01-30 LAB — POCT URINALYSIS DIPSTICK
Bilirubin, UA: NEGATIVE
Blood, UA: NEGATIVE
Glucose, UA: NEGATIVE
Ketones, UA: NEGATIVE
Leukocytes, UA: NEGATIVE
Nitrite, UA: NEGATIVE
Protein, UA: NEGATIVE
Spec Grav, UA: 1.02 (ref 1.010–1.025)
Urobilinogen, UA: 0.2 E.U./dL
pH, UA: 6.5 (ref 5.0–8.0)

## 2018-01-30 LAB — COMPREHENSIVE METABOLIC PANEL
ALK PHOS: 58 U/L (ref 39–117)
ALT: 18 U/L (ref 0–35)
AST: 21 U/L (ref 0–37)
Albumin: 4.6 g/dL (ref 3.5–5.2)
BUN: 17 mg/dL (ref 6–23)
CO2: 32 mEq/L (ref 19–32)
Calcium: 10.7 mg/dL — ABNORMAL HIGH (ref 8.4–10.5)
Chloride: 100 mEq/L (ref 96–112)
Creatinine, Ser: 0.9 mg/dL (ref 0.40–1.20)
GFR: 65.86 mL/min (ref >60.00)
GLUCOSE: 91 mg/dL (ref 70–99)
POTASSIUM: 4.7 meq/L (ref 3.5–5.1)
Sodium: 138 mEq/L (ref 135–145)
Total Bilirubin: 0.9 mg/dL (ref 0.2–1.2)
Total Protein: 6.6 g/dL (ref 6.0–8.3)

## 2018-01-30 LAB — LIPID PANEL
Cholesterol: 209 mg/dL — ABNORMAL HIGH (ref 0–200)
HDL: 59.5 mg/dL (ref >39.00)
NonHDL: 149.2
Total CHOL/HDL Ratio: 4
Triglycerides: 246 mg/dL — ABNORMAL HIGH (ref 0.0–149.0)
VLDL: 49.2 mg/dL — ABNORMAL HIGH (ref 0.0–40.0)

## 2018-01-30 LAB — LDL CHOLESTEROL, DIRECT: Direct LDL: 117 mg/dL

## 2018-01-30 LAB — TSH: TSH: 2.53 u[IU]/mL (ref 0.35–4.50)

## 2018-01-30 LAB — VITAMIN D 25 HYDROXY (VIT D DEFICIENCY, FRACTURES): VITD: 86.67 ng/mL (ref 30.00–100.00)

## 2018-01-30 LAB — HEMOGLOBIN A1C: Hgb A1c MFr Bld: 5.8 % (ref 4.6–6.5)

## 2018-01-30 MED ORDER — BUPROPION HCL ER (SR) 150 MG PO TB12: 150.0000 mg | ORAL_TABLET | Freq: Two times a day (BID) | ORAL | 1 refills | Status: DC

## 2018-01-30 MED ORDER — GABAPENTIN 300 MG PO CAPS: 300.0000 mg | ORAL_CAPSULE | Freq: Three times a day (TID) | ORAL | 1 refills | Status: DC

## 2018-01-30 MED ORDER — AMLODIPINE BESYLATE 2.5 MG PO TABS: 2.5000 mg | ORAL_TABLET | Freq: Every day | ORAL | 3 refills | Status: DC

## 2018-01-30 MED ORDER — ROSUVASTATIN CALCIUM 10 MG PO TABS: 10.0000 mg | ORAL_TABLET | Freq: Every day | ORAL | 3 refills | Status: DC

## 2018-01-30 MED ORDER — ZOLPIDEM TARTRATE 5 MG PO TABS: 2.5000 mg | ORAL_TABLET | Freq: Every evening | ORAL | 5 refills | Status: DC | PRN

## 2018-01-30 NOTE — Progress Notes (Signed)
Patient ID: Nichole [name], female  DOB: [birth year], 68 y.o.   MRN: [id number] Patient Care Team    Relationship Specialty Notifications Start End  Ma Hillock, DO PCP - General Family Medicine  12/24/14   Suella Broad, MD Consulting Physician Physical Medicine and Rehabilitation  12/31/15    Comment: pain mgmt  Megan Salon, MD Consulting Physician Gynecology  12/31/15   Richmond Campbell, MD Consulting Physician Gastroenterology  12/31/15   Wallene Huh, [place] Consulting Physician Podiatry  01/10/17   Rolm Bookbinder, MD Consulting Physician Dermatology  01/10/17   Ralene Bathe, MD Consulting Physician Ophthalmology  01/10/17   Toribio Harbour, [place] Referring Physician Chiropractic Medicine  01/10/17     Chief Complaint  Patient presents with  . Annual Exam    Subjective:  Nichole [name] is a 68 y.o.  Female  present for CPE. All past medical history, surgical history, allergies, family history, immunizations, medications and social history were updated in the electronic medical record today. All recent labs, ED visits and hospitalizations within the last year were reviewed.  Health maintenance: updated 02/01/18 Colonoscopy: completed 01/03/2013, 5 year recall, prior h/o polyps, Dr. Earlean Shawl she is scheduling- she is aware it is due. Mammogram: completed: 08/01/2017, NL, rpt yearly, completed [facility].   Cervical cancer screening: >65 not indicated, last pap: 2015, results: neg, completed by: Dr. Sabra Heck (GYN) follows with yearly.  Immunizations: tdap 05/01/2014, Influenza declined (encouraged yearly), PNA series completed, shingrix #1--> need now- she will look into getting it.  Infectious disease screening: Hep C completed  DEXA: last completed 02/10/2017. Assistive device: none Oxygen EHM:CNOB Patient has a Dental home. Hospitalizations/ED visits: None   Depression screen [facility] 2/9 01/30/2018 01/30/2018 01/10/2017 12/31/2015  Decreased Interest 1 0 0 1  Down, Depressed, Hopeless 1 0 1  3  PHQ - 2 Score 2 0 1 4  Altered sleeping 2 - 1 1  Tired, decreased energy 1 - 2 2  Change in appetite 2 - 1 0  Feeling bad or failure about yourself  0 - 1 2  Trouble concentrating 0 - 0 0  Moving slowly or fidgety/restless 0 - 0 0  Suicidal thoughts 0 - 0 0  PHQ-9 Score 7 - 6 9  Difficult doing work/chores - - Not difficult at all Somewhat difficult   GAD 7 : Generalized Anxiety Score 01/30/2018  Nervous, Anxious, on Edge 1  Control/stop worrying 1  Worry too much - different things 0  Trouble relaxing 0  Restless 0  Easily annoyed or irritable 1  Afraid - awful might happen 0  Total GAD 7 Score 3    Immunization History  Administered Date(s) Administered  . Pneumococcal Conjugate-13 12/31/2015  . Pneumococcal Polysaccharide-23 05/01/2014  . Tdap 11/17/2011, 05/01/2014  . Zoster 11/16/2009  . Zoster Recombinat (Shingrix) 07/31/2017     Past Medical History:  Diagnosis Date  . Anxiety   . Asthma   . Basal cell carcinoma   . Capsulitis of foot 05/02/2012   acute capsulitis 2nd mpj bilateral .  . Chronic headaches   . Colon polyp    tubular adenoma  . Constipation   . Depression (emotion)   . Elevated hemoglobin A1c   . Fatigue   . Frequent headaches   . Hyperlipidemia   . Hyponatremia    2012  . IBS (irritable bowel syndrome)   . Insomnia   . Neuropathy    fingers/feet  . Osteoarthritis   .  Osteopenia   . Raynaud's disease /phenomenon   . Rosacea   . Tinnitus aurium, bilateral    Allergies  Allergen Reactions  . Codeine     Crawling skin , weird twilight dreams  . Tramadol     Itching    Past Surgical History:  Procedure Laterality Date  . BREAST REDUCTION SURGERY N/A    complications of blood supply  . BUNIONECTOMY Bilateral   . CARDIAC CATHETERIZATION     06/2005  . DENTAL SURGERY    . DIGIT NAIL REMOVAL Right    fingernail removed 4th  . FACIAL COSMETIC SURGERY    . VAGINAL HYSTERECTOMY N/A    Family History  Problem Relation Age of  Onset  . Dementia Mother   . Cancer Father   . Hypertension Father   . Fibromyalgia Sister   . Heart attack Maternal Aunt   . Heart attack Maternal Uncle   . Cancer Paternal Aunt   . Breast cancer Paternal Aunt   . Hypertension Sister   . Breast cancer Cousin   . Heart attack Maternal Uncle   . Rheum arthritis Paternal Grandmother    Social History   Socioeconomic History  . Marital status: Married    Spouse name: Not on file  . Number of children: Not on file  . Years of education: Not on file  . Highest education level: Not on file  Occupational History  . Not on file  Social Needs  . Financial resource strain: Not on file  . Food insecurity:    Worry: Not on file    Inability: Not on file  . Transportation needs:    Medical: Not on file    Non-medical: Not on file  Tobacco Use  . Smoking status: Never Smoker  . Smokeless tobacco: Never Used  Substance and Sexual Activity  . Alcohol use: No  . Drug use: No  . Sexual activity: Yes    Birth control/protection: Post-menopausal    Comment: maybe once a month due to vaginal dryness  Lifestyle  . Physical activity:    Days per week: Not on file    Minutes per session: Not on file  . Stress: Not on file  Relationships  . Social connections:    Talks on phone: Not on file    Gets together: Not on file    Attends religious service: Not on file    Active member of club or organization: Not on file    Attends meetings of clubs or organizations: Not on file    Relationship status: Not on file  . Intimate partner violence:    Fear of current or ex partner: Not on file    Emotionally abused: Not on file    Physically abused: Not on file    Forced sexual activity: Not on file  Other Topics Concern  . Not on file  Social History Narrative  . Not on file   Allergies as of 01/30/2018      Reactions   Codeine    Crawling skin , weird twilight dreams   Tramadol    Itching      Medication List       Accurate as  of January 30, 2018 11:59 PM. Always use your most recent med list.        amLODipine 2.5 MG tablet Commonly known as:  NORVASC Take 1 tablet (2.5 mg total) by mouth daily. (For Raynauds )   buPROPion 150 MG 12 hr tablet Commonly  known as:  WELLBUTRIN SR Take 1 tablet (150 mg total) by mouth 2 (two) times daily.   CALCIUM 1200 PO Take 600 mg by mouth.   FINACEA 15 % cream Generic drug:  Azelaic Acid   fluticasone 50 MCG/ACT nasal spray Commonly known as:  FLONASE Place 2 sprays into both nostrils daily.   gabapentin 300 MG capsule Commonly known as:  NEURONTIN Take 1 capsule (300 mg total) by mouth 3 (three) times daily.   HYDROmorphone 2 MG tablet Commonly known as:  DILAUDID   lidocaine 5 % Commonly known as:  LIDODERM Place 1 patch onto the skin.   loratadine 10 MG tablet Commonly known as:  CLARITIN Take 10 mg by mouth daily. Pt breaks tablet into fourths and only takes one fourth.   MAGNESIUM PO Take by mouth.   methocarbamol 750 MG tablet Commonly known as:  ROBAXIN Take 750 mg by mouth every 6 (six) hours as needed for muscle spasms.   multivitamin tablet Take 1 tablet by mouth daily.   NON FORMULARY Nerve Renew   Omega 3 1000 MG Caps Take by mouth.   rosuvastatin 10 MG tablet Commonly known as:  CRESTOR Take 1 tablet (10 mg total) by mouth daily.   VITAMIN B 12 PO Take 1,000 mcg by mouth.   VITAMIN C PO Take by mouth.   VITAMIN E PO Take by mouth.   YUVAFEM 10 MCG Tabs vaginal tablet Generic drug:  Estradiol 1 TAB PER VAGINA TWICE WEEKLY   zolpidem 5 MG tablet Commonly known as:  AMBIEN Take 0.5-1 tablets (2.5-5 mg total) by mouth at bedtime as needed for sleep.       All past medical history, surgical history, allergies, family history, immunizations andmedications were updated in the EMR today and reviewed under the history and medication portions of their EMR.       ROS: 14 pt review of systems performed and negative (unless  mentioned in an HPI)  Objective: BP 116/71 (BP Location: Left Arm, Patient Position: Sitting, Cuff Size: Normal)   Pulse (!) 59   Temp (!) 97.4 F (36.3 C) (Oral)   Resp 16   Ht _0  (1.499 m)   Wt 123 lb 4 oz (55.9 kg)   SpO2 99%   BMI 24.89 kg/m  Gen: Afebrile. No acute distress. Nontoxic in appearance, well-developed, well-nourished, Caucasian female HENT: AT. [place]. Bilateral TM visualized and normal in appearance, normal external auditory canal. MMM, no oral lesions, adequate dentition. Bilateral nares within normal limits. Throat without erythema, ulcerations or exudates.  No cough on exam, no hoarseness on exam. Eyes:Pupils Equal Round Reactive to light, Extraocular movements intact,  Conjunctiva without redness, discharge or icterus. Neck/lymp/endocrine: Supple, no lymphadenopathy, no thyromegaly CV: RRR , no edema, +2/4 P posterior tibialis pulses.  No carotid bruits. No JVD. Chest: CTAB, no wheeze, rhonchi or crackles.  Normal respiratory effort.  Good air movement. Abd: Soft.  Flat. NTND. BS present.  No masses palpated. No hepatosplenomegaly. No rebound tenderness or guarding. Skin: No rashes, purpura or petechiae. Warm and well-perfused. Skin intact. Neuro/Msk: Appears uncomfortable today with her back. PERLA. EOMi. Alert. Oriented x3.   Psych: Normal affect, dress and demeanor. Normal speech. Normal thought content and judgment.   No exam data present  Assessment/plan: Nichole [name] is a 68 y.o. female present for CPE . Elevated hemoglobin A1c Diet and routine exercise encouraged. - HgB A1c Hyperlipidemia, unspecified hyperlipidemia type Diet and exercise encouraged. - Comp Met (CMET) -  TSH - Lipid panel - rosuvastatin (CRESTOR) 10 MG tablet; Take 1 tablet (10 mg total) by mouth daily.  Dispense: 90 tablet; Refill: 3 Screening for deficiency anemia - CBC Raynaud's disease without gangrene - controlled/stable.  Refill on amlodipine for 1 year. - amLODipine (NORVASC)  2.5 MG tablet; Take 1 tablet (2.5 mg total) by mouth daily. (For Raynauds )  Dispense: 90 tablet; Refill: 3 Sleep disturbance Stable.  [place] controlled substance database reviewed 02/01/18 Appropriate. -Ambien/controlled contract signed. - zolpidem (AMBIEN) 5 MG tablet; Take 0.5-1 tablets (2.5-5 mg total) by mouth at bedtime as needed for sleep.  Dispense: 30 tablet; Refill: 5 Lumbar radicular pain - She is currently having a flare of her back- she has appt scheduled with ortho which manages her pain.  - gabapentin (NEURONTIN) 300 MG capsule; Take 1 capsule (300 mg total) by mouth 3 (three) times daily.  Dispense: 90 capsule; Refill: 1 Anxiety and depression -[facility].  Refills provided on Wellbutrin. - buPROPion (WELLBUTRIN SR) 150 MG 12 hr tablet; Take 1 tablet (150 mg total) by mouth 2 (two) times daily.  Dispense: 180 tablet; Refill: 1 - 6 mos.  Vitamin D deficiency Takes supplement and calcium - Vitamin D (25 hydroxy) Urinary frequency After appt over- pt reported urinary frequency to staff and concern over UTI. POCT collected and normal. Pt was called back and made aware of negative urine findings and recommendations made.  - POCT urinalysis dipstick  Encounter for preventive health examination Patient was encouraged to exercise greater than 150 minutes a week. Patient was encouraged to choose a diet filled with fresh fruits and vegetables, and lean meats. AVS provided to patient today for education/recommendation on gender specific health and safety maintenance. Colonoscopy: completed 01/03/2013, 5 year recall, prior h/o polyps, Dr. Earlean Shawl she is scheduling- she is aware it is due. Mammogram: completed: 08/01/2017, NL, rpt yearly, completed [facility].   Cervical cancer screening: >65 not indicated, last pap: 2015, results: neg, completed by: Dr. Sabra Heck (GYN) follows with yearly.  Immunizations: tdap 05/01/2014, Influenza declined (encouraged yearly), PNA series completed, shingrix  #1--> need now- she will look into getting it.  Infectious disease screening: Hep C completed  DEXA: last completed 02/10/2017.  Return in about 6 months (around 07/31/2018) for depression/anxiety/CMC. 1 year for CPE  CPE completed today, as well as greater than 15 minutes spent evaluating and treating additional problems today including urinary frequency, lumbar radicular pain, sleep disturbance.  In which greater than 50% of that time was spent face-to-face.  Electronically signed by: Howard Pouch, DO [place]

## 2018-01-30 NOTE — Patient Instructions (Signed)
Health Maintenance After Age 70 After age 70, you are at a higher risk for certain long-term diseases and infections as well as injuries from falls. Falls are a major cause of broken bones and head injuries in people who are older than age 70. Getting regular preventive care can help to keep you healthy and well. Preventive care includes getting regular testing and making lifestyle changes as recommended by your health care provider. Talk with your health care provider about:  Which screenings and tests you should have. A screening is a test that checks for a disease when you have no symptoms.  A diet and exercise plan that is right for you. What should I know about screenings and tests to prevent falls? Screening and testing are the best ways to find a health problem early. Early diagnosis and treatment give you the best chance of managing medical conditions that are common after age 70. Certain conditions and lifestyle choices may make you more likely to have a fall. Your health care provider may recommend:  Regular vision checks. Poor vision and conditions such as cataracts can make you more likely to have a fall. If you wear glasses, make sure to get your prescription updated if your vision changes.  Medicine review. Work with your health care provider to regularly review all of the medicines you are taking, including over-the-counter medicines. Ask your health care provider about any side effects that may make you more likely to have a fall. Tell your health care provider if any medicines that you take make you feel dizzy or sleepy.  Osteoporosis screening. Osteoporosis is a condition that causes the bones to get weaker. This can make the bones weak and cause them to break more easily.  Blood pressure screening. Blood pressure changes and medicines to control blood pressure can make you feel dizzy.  Strength and balance checks. Your health care provider may recommend certain tests to check your  strength and balance while standing, walking, or changing positions.  Foot health exam. Foot pain and numbness, as well as not wearing proper footwear, can make you more likely to have a fall.  Depression screening. You may be more likely to have a fall if you have a fear of falling, feel emotionally low, or feel unable to do activities that you used to do.  Alcohol use screening. Using too much alcohol can affect your balance and may make you more likely to have a fall. What actions can I take to lower my risk of falls? General instructions  Talk with your health care provider about your risks for falling. Tell your health care provider if: ? You fall. Be sure to tell your health care provider about all falls, even ones that seem minor. ? You feel dizzy, sleepy, or off-balance.  Take over-the-counter and prescription medicines only as told by your health care provider. These include any supplements.  Eat a healthy diet and maintain a healthy weight. A healthy diet includes low-fat dairy products, low-fat (lean) meats, and fiber from whole grains, beans, and lots of fruits and vegetables. Home safety  Remove any tripping hazards, such as rugs, cords, and clutter.  Install safety equipment such as grab bars in bathrooms and safety rails on stairs.  Keep rooms and walkways well-lit. Activity   Follow a regular exercise program to stay fit. This will help you maintain your balance. Ask your health care provider what types of exercise are appropriate for you.  If you need a cane or   walker, use it as recommended by your health care provider.  Wear supportive shoes that have nonskid soles. Lifestyle  Do not drink alcohol if your health care provider tells you not to drink.  If you drink alcohol, limit how much you have: ? 0-1 drink a day for women. ? 0-2 drinks a day for men.  Be aware of how much alcohol is in your drink. In the [place] one drink equals one typical bottle of beer (12  oz), one-half glass of wine (5 oz), or one shot of hard liquor (1 oz).  Do not use any products that contain nicotine or tobacco, such as cigarettes and e-cigarettes. If you need help quitting, ask your health care provider. Summary  Having a healthy lifestyle and getting preventive care can help to protect your health and wellness after age 70.  Screening and testing are the best way to find a health problem early and help you avoid having a fall. Early diagnosis and treatment give you the best chance for managing medical conditions that are more common for people who are older than age 70.  Falls are a major cause of broken bones and head injuries in people who are older than age 70. Take precautions to prevent a fall at home.  Work with your health care provider to learn what changes you can make to improve your health and wellness and to prevent falls. This information is not intended to replace advice given to you by your health care provider. Make sure you discuss any questions you have with your health care provider. Document Released: 11/23/2016 Document Revised: 11/23/2016 Document Reviewed: 11/23/2016 Elsevier Interactive Patient Education  2019 [employer]  

## 2018-01-31 ENCOUNTER — Telehealth: Payer: Self-pay | Admitting: Family Medicine

## 2018-01-31 NOTE — Telephone Encounter (Signed)
Spoke with patient to give results.   Stated verbal understanding. Patient states that she cannot drink 80oz of water a day, that she is drinking enough as is.  However, she is aware of medication changes. Lab appointment was scheduled for 4 weeks for Repeat lab test.

## 2018-01-31 NOTE — Telephone Encounter (Signed)
Please inform patient the following information: Her labs are stable. Her calcium was mildly  higher than normal. There are many potential causes for mildly elevated calcium including lack of adequate hydration, too much calcium supplement intake and certain endocrine or genetic disorders.  - I would recommend she stop her calcium supplement, lower her vit D supplement to half of what she is currently taking (her vit d level was ~80--> level of 50 is adequate). Drink at least 80 ounces of water a day. Repeat lab by lab appt only in 4 weeks to recheck. If abnormal again- then we will have her come in to discuss and pursue further work up.  Her urinary symptoms could have been caused by the elevated calcium... additional signs/symptoms to monitor for are: Loss of appetite.,Nausea and vomiting.,Constipation and abdominal pain.,Increased thirst and frequent urination. If Fatigue, weakness, and muscle pain. Confusion, disorientation, and difficulty thinking or Headaches are experienced.--> be seen urgently . Marland Kitchen

## 2018-02-01 ENCOUNTER — Encounter: Payer: Self-pay | Admitting: Family Medicine

## 2018-02-01 DIAGNOSIS — R350 Frequency of micturition: Secondary | ICD-10-CM | POA: Insufficient documentation

## 2018-02-06 DIAGNOSIS — M545 Low back pain: Secondary | ICD-10-CM | POA: Diagnosis not present

## 2018-02-06 DIAGNOSIS — M419 Scoliosis, unspecified: Secondary | ICD-10-CM | POA: Insufficient documentation

## 2018-02-06 DIAGNOSIS — M5136 Other intervertebral disc degeneration, lumbar region: Secondary | ICD-10-CM | POA: Diagnosis not present

## 2018-02-12 DIAGNOSIS — M5136 Other intervertebral disc degeneration, lumbar region: Secondary | ICD-10-CM | POA: Diagnosis not present

## 2018-02-14 ENCOUNTER — Other Ambulatory Visit: Payer: Self-pay

## 2018-02-14 ENCOUNTER — Ambulatory Visit (INDEPENDENT_AMBULATORY_CARE_PROVIDER_SITE_OTHER): Payer: PPO | Admitting: Obstetrics & Gynecology

## 2018-02-14 ENCOUNTER — Encounter: Payer: Self-pay | Admitting: Obstetrics & Gynecology

## 2018-02-14 VITALS — BP 106/62 | HR 68 | Resp 16 | Ht 59.25 in | Wt 125.6 lb

## 2018-02-14 DIAGNOSIS — Z01419 Encounter for gynecological examination (general) (routine) without abnormal findings: Secondary | ICD-10-CM

## 2018-02-14 MED ORDER — LIDOCAINE 5 % EX OINT: TOPICAL_OINTMENT | CUTANEOUS | 0 refills | Status: AC

## 2018-02-14 MED ORDER — LIDOCAINE 5 % EX OINT: TOPICAL_OINTMENT | CUTANEOUS | 0 refills | Status: DC

## 2018-02-14 MED ORDER — ESTRADIOL 10 MCG VA TABS: 1.0000 | ORAL_TABLET | VAGINAL | 4 refills | Status: DC

## 2018-02-14 NOTE — Progress Notes (Signed)
68 y.o. G2P0202 Married White or Caucasian female here for annual exam.  Saw Dr. Raoul Pitch in January.  Had blood work done.  Calcium was mildly elevated.  Has follow up scheduled.  Has received her colonoscopy recall from Dr. Earlean Shawl.  Knows she call to schedule this.  Reports husband had cellulitis from staph.  Was admitted for I&D and was on IV antibiotics in the hospital.  He is much better.    Denies vaginal bleeding.    Having more issues with her back.  Having issues with back pain and and pain shooting down the back of both legs.  Right side is worse.  Having an MRI on Monday.  This is scheduled at [facility].  No LMP recorded. Patient has had a hysterectomy.          Sexually active: Yes.    The current method of family planning is status post hysterectomy.    Exercising: Yes.    walk Smoker:  no  Health Maintenance: Pap:  2005 Neg  History of abnormal Pap:  no  MMG:  08/01/17 BIRADS2:Benign  Colonoscopy:  01/03/13 normal. F/u 5 years  BMD:   02/10/17 Normal TDaP:  2016 Pneumonia vaccine(s):  2017 Shingrix:   Completed  Hep C testing: 12/24/14 Neg  Screening Labs: PCP   reports that she has never smoked. She has never used smokeless tobacco. She reports that she does not drink alcohol or use drugs.  Past Medical History:  Diagnosis Date  . Anxiety   . Asthma   . Basal cell carcinoma   . Capsulitis of foot 05/02/2012   acute capsulitis 2nd mpj bilateral .  . Chronic headaches   . Colon polyp    tubular adenoma  . Constipation   . Depression (emotion)   . Elevated hemoglobin A1c   . Fatigue   . Frequent headaches   . Hyperlipidemia   . Hyponatremia    2012  . IBS (irritable bowel syndrome)   . Insomnia   . Neuropathy    fingers/feet  . Osteoarthritis   . Osteopenia   . Raynaud's disease /phenomenon   . Rosacea   . Tinnitus aurium, bilateral     Past Surgical History:  Procedure Laterality Date  . BREAST REDUCTION SURGERY N/A    complications of blood  supply  . BUNIONECTOMY Bilateral   . CARDIAC CATHETERIZATION     06/2005  . DENTAL SURGERY    . DIGIT NAIL REMOVAL Right    fingernail removed 4th  . FACIAL COSMETIC SURGERY    . VAGINAL HYSTERECTOMY N/A     Current Outpatient Medications  Medication Sig Dispense Refill  . amLODipine (NORVASC) 2.5 MG tablet Take 1 tablet (2.5 mg total) by mouth daily. (For Raynauds ) 90 tablet 3  . Ascorbic Acid (VITAMIN C PO) Take by mouth.    Marland Kitchen buPROPion (WELLBUTRIN SR) 150 MG 12 hr tablet Take 1 tablet (150 mg total) by mouth 2 (two) times daily. 180 tablet 1  . Cyanocobalamin (VITAMIN B 12 PO) Take 1,000 mcg by mouth.    Marland Kitchen FINACEA 15 % cream     . fluticasone (FLONASE) 50 MCG/ACT nasal spray Place 2 sprays into both nostrils daily. 16 g 2  . gabapentin (NEURONTIN) 300 MG capsule Take 1 capsule (300 mg total) by mouth 3 (three) times daily. 90 capsule 1  . HYDROmorphone (DILAUDID) 2 MG tablet   0  . lidocaine (LIDODERM) 5 % Place 1 patch onto the skin.    Marland Kitchen  loratadine (CLARITIN) 10 MG tablet Take 10 mg by mouth daily. Pt breaks tablet into fourths and only takes one fourth.    Marland Kitchen MAGNESIUM PO Take by mouth.    . meloxicam (MOBIC) 15 MG tablet Take 15 mg by mouth daily as needed.    . methocarbamol (ROBAXIN) 750 MG tablet Take 750 mg by mouth every 6 (six) hours as needed for muscle spasms.    . NON FORMULARY Nerve Renew    . Omega 3 1000 MG CAPS Take by mouth.    . rosuvastatin (CRESTOR) 10 MG tablet Take 1 tablet (10 mg total) by mouth daily. 90 tablet 3  . VITAMIN E PO Take by mouth.    Merril Abbe 10 MCG TABS vaginal tablet 1 TAB PER VAGINA TWICE WEEKLY 24 tablet 1  . zolpidem (AMBIEN) 5 MG tablet Take 0.5-1 tablets (2.5-5 mg total) by mouth at bedtime as needed for sleep. 30 tablet 5   No current facility-administered medications for this visit.     Family History  Problem Relation Age of Onset  . Dementia Mother   . Cancer Father   . Hypertension Father   . Fibromyalgia Sister   . Heart  attack Maternal Aunt   . Heart attack Maternal Uncle   . Cancer Paternal Aunt   . Breast cancer Paternal Aunt   . Hypertension Sister   . Breast cancer Cousin   . Heart attack Maternal Uncle   . Rheum arthritis Paternal Grandmother     Review of Systems  Genitourinary: Positive for dyspareunia.  Musculoskeletal: Positive for arthralgias.  Skin:       itching  Hematological: Bruises/bleeds easily.  All other systems reviewed and are negative.   Exam:   BP 106/62 (BP Location: Right Arm, Patient Position: Sitting, Cuff Size: Normal)   Pulse 68   Resp 16   Ht 4' 11.25" (1.505 m)   Wt 125 lb 9.6 oz (57 kg)   BMI 25.15 kg/m    Height: 4' 11.25" (150.5 cm)  Ht Readings from Last 3 Encounters:  02/14/18 4' 11.25" (1.505 m)  01/30/18 4\' 11"  (1.499 m)  08/21/17 4' 11.75" (1.518 m)    General appearance: alert, cooperative and appears stated age Head: Normocephalic, without obvious abnormality, atraumatic Neck: no adenopathy, supple, symmetrical, trachea midline and thyroid normal to inspection and palpation Lungs: clear to auscultation bilaterally Breasts: normal appearance, no masses or tenderness, absent nipples and areolas bilaterally Heart: regular rate and rhythm Abdomen: soft, non-tender; bowel sounds normal; no masses,  no organomegaly Extremities: extremities normal, atraumatic, no cyanosis or edema Skin: Skin color, texture, turgor normal. No rashes or lesions Lymph nodes: Cervical, supraclavicular, and axillary nodes normal. No abnormal inguinal nodes palpated Neurologic: Grossly normal   Pelvic: External genitalia:  no lesions              Urethra:  normal appearing urethra with no masses, tenderness or lesions              Bartholins and Skenes: normal                 Vagina: normal appearing vagina with normal color and discharge, no lesions              Cervix: absent              Pap taken: No. Bimanual Exam:  Uterus:  uterus absent              Adnexa:  normal  adnexa and no mass, fullness, tenderness               Rectovaginal: Confirms               Anus:  normal sphincter tone, no lesions  Chaperone was present for exam.  A:  Well Woman with normal exam PMP, no HRT H/O breast reduction with complications, now with no nipples bilaterally H/O depression/anxiety S/p TVH, ovaries remain Vaginal atrophic changes with dyspareunia  P:   Mammogram guidelines reviewed pap smear not indicated Blood work just done with [employer] Vaccines are UTD Aware colonoscopy was due in December.  She has gotten a notice about this. RF for vagifem 77meq pv twice weekly.  #24/4RF Trial of topical lidocaine 5% topically before intercourse.  #35gm tube/0RF return annually or prn

## 2018-02-16 DIAGNOSIS — M5136 Other intervertebral disc degeneration, lumbar region: Secondary | ICD-10-CM | POA: Diagnosis not present

## 2018-02-23 ENCOUNTER — Other Ambulatory Visit: Payer: Self-pay | Admitting: Orthopedic Surgery

## 2018-02-23 DIAGNOSIS — M5136 Other intervertebral disc degeneration, lumbar region: Secondary | ICD-10-CM

## 2018-02-28 ENCOUNTER — Other Ambulatory Visit: Payer: PPO

## 2018-03-01 ENCOUNTER — Other Ambulatory Visit (INDEPENDENT_AMBULATORY_CARE_PROVIDER_SITE_OTHER): Payer: PPO

## 2018-03-01 ENCOUNTER — Other Ambulatory Visit: Payer: Self-pay | Admitting: Orthopedic Surgery

## 2018-03-01 DIAGNOSIS — M5136 Other intervertebral disc degeneration, lumbar region: Secondary | ICD-10-CM

## 2018-03-02 LAB — COMPREHENSIVE METABOLIC PANEL
ALT: 18 U/L (ref 0–35)
AST: 22 U/L (ref 0–37)
Albumin: 4.6 g/dL (ref 3.5–5.2)
Alkaline Phosphatase: 68 U/L (ref 39–117)
BUN: 17 mg/dL (ref 6–23)
CHLORIDE: 102 meq/L (ref 96–112)
CO2: 31 mEq/L (ref 19–32)
Calcium: 10 mg/dL (ref 8.4–10.5)
Creatinine, Ser: 0.93 mg/dL (ref 0.40–1.20)
GFR: 59.65 mL/min — ABNORMAL LOW (ref >60.00)
Glucose, Bld: 82 mg/dL (ref 70–99)
POTASSIUM: 4.6 meq/L (ref 3.5–5.1)
Sodium: 141 mEq/L (ref 135–145)
Total Bilirubin: 0.5 mg/dL (ref 0.2–1.2)
Total Protein: 6.7 g/dL (ref 6.0–8.3)

## 2018-03-06 ENCOUNTER — Ambulatory Visit
Admission: RE | Admit: 2018-03-06 | Discharge: 2018-03-06 | Disposition: A | Discharge status: Home / Self Care | Payer: Self-pay | Source: Ambulatory Visit | Attending: Orthopedic Surgery | Admitting: Orthopedic Surgery

## 2018-03-06 ENCOUNTER — Other Ambulatory Visit: Payer: Self-pay | Admitting: Orthopedic Surgery

## 2018-03-06 DIAGNOSIS — R52 Pain, unspecified: Secondary | ICD-10-CM

## 2018-03-07 ENCOUNTER — Ambulatory Visit
Admission: RE | Admit: 2018-03-07 | Discharge: 2018-03-07 | Disposition: A | Discharge status: Home / Self Care | Payer: PPO | Source: Ambulatory Visit | Attending: Orthopedic Surgery | Admitting: Orthopedic Surgery

## 2018-03-07 DIAGNOSIS — M5136 Other intervertebral disc degeneration, lumbar region: Secondary | ICD-10-CM | POA: Diagnosis not present

## 2018-03-07 MED ORDER — IOPAMIDOL (ISOVUE-M 200) INJECTION 41%
1.0000 mL | Freq: Once | INTRAMUSCULAR | Status: AC
  Administered 2018-03-07: 1 mL

## 2018-03-07 NOTE — Discharge Instructions (Signed)

## 2018-03-19 ENCOUNTER — Telehealth: Payer: Self-pay | Admitting: Family Medicine

## 2018-03-19 DIAGNOSIS — M5416 Radiculopathy, lumbar region: Secondary | ICD-10-CM | POA: Diagnosis not present

## 2018-03-19 DIAGNOSIS — M5030 Other cervical disc degeneration, unspecified cervical region: Secondary | ICD-10-CM | POA: Diagnosis not present

## 2018-03-19 DIAGNOSIS — M5136 Other intervertebral disc degeneration, lumbar region: Secondary | ICD-10-CM | POA: Diagnosis not present

## 2018-03-19 NOTE — Telephone Encounter (Signed)
Patient dropped off an [facility] form to be completed for her upcoming back surgery.

## 2018-03-20 NOTE — Telephone Encounter (Signed)
Form from [facility] for pending surgery, LU-5 Fusion, Surgery date TBS. Last CPE and labs performed on 01/30/2018. Form and last CPE notes/medication list placed on Dr Dierdre Highman desk to review and sign.

## 2018-03-22 DIAGNOSIS — Z0279 Encounter for issue of other medical certificate: Secondary | ICD-10-CM

## 2018-03-22 NOTE — Telephone Encounter (Signed)
Form completed and returned to staff.

## 2018-03-22 NOTE — Telephone Encounter (Signed)
Form has been faxed to [facility] and given to Diane to complete the form charge.

## 2018-04-04 DIAGNOSIS — L309 Dermatitis, unspecified: Secondary | ICD-10-CM | POA: Diagnosis not present

## 2018-04-04 DIAGNOSIS — Z85828 Personal history of other malignant neoplasm of skin: Secondary | ICD-10-CM | POA: Diagnosis not present

## 2018-04-04 DIAGNOSIS — Z419 Encounter for procedure for purposes other than remedying health state, unspecified: Secondary | ICD-10-CM | POA: Diagnosis not present

## 2018-04-26 ENCOUNTER — Ambulatory Visit: Payer: PPO | Admitting: Obstetrics & Gynecology

## 2018-04-26 ENCOUNTER — Encounter

## 2018-06-28 DIAGNOSIS — M5030 Other cervical disc degeneration, unspecified cervical region: Secondary | ICD-10-CM | POA: Diagnosis not present

## 2018-07-25 ENCOUNTER — Other Ambulatory Visit: Payer: Self-pay

## 2018-07-25 DIAGNOSIS — M5416 Radiculopathy, lumbar region: Secondary | ICD-10-CM

## 2018-07-25 MED ORDER — GABAPENTIN 300 MG PO CAPS: 300.0000 mg | ORAL_CAPSULE | Freq: Three times a day (TID) | ORAL | 0 refills | Status: DC

## 2018-07-25 NOTE — Telephone Encounter (Signed)
Faxed refill request for Gabapentin 300mg  #90 x1 refill from [facility] 01/30/2018 NOV 08/14/2018 Last filled 01/30/2018 #90 x1 refill   Please advise

## 2018-07-31 ENCOUNTER — Ambulatory Visit: Payer: PPO | Admitting: Family Medicine

## 2018-08-01 ENCOUNTER — Other Ambulatory Visit: Payer: Self-pay | Admitting: Family Medicine

## 2018-08-01 DIAGNOSIS — G479 Sleep disorder, unspecified: Secondary | ICD-10-CM

## 2018-08-07 DIAGNOSIS — Z1231 Encounter for screening mammogram for malignant neoplasm of breast: Secondary | ICD-10-CM | POA: Diagnosis not present

## 2018-08-07 DIAGNOSIS — Z803 Family history of malignant neoplasm of breast: Secondary | ICD-10-CM | POA: Diagnosis not present

## 2018-08-07 LAB — HM MAMMOGRAPHY

## 2018-08-09 ENCOUNTER — Encounter: Payer: Self-pay | Admitting: Family Medicine

## 2018-08-10 ENCOUNTER — Telehealth: Payer: Self-pay | Admitting: Family Medicine

## 2018-08-10 NOTE — Telephone Encounter (Signed)
Please inform patient the following information: Her mammogram was negative

## 2018-08-13 NOTE — Telephone Encounter (Signed)
Patient has been given results. Stated verbal understanding.

## 2018-08-14 ENCOUNTER — Encounter: Payer: Self-pay | Admitting: Family Medicine

## 2018-08-14 ENCOUNTER — Ambulatory Visit (INDEPENDENT_AMBULATORY_CARE_PROVIDER_SITE_OTHER): Payer: PPO | Admitting: Family Medicine

## 2018-08-14 ENCOUNTER — Other Ambulatory Visit: Payer: Self-pay

## 2018-08-14 VITALS — Temp 98.4°F | Ht 59.25 in | Wt 119.0 lb

## 2018-08-14 DIAGNOSIS — G479 Sleep disorder, unspecified: Secondary | ICD-10-CM

## 2018-08-14 DIAGNOSIS — E785 Hyperlipidemia, unspecified: Secondary | ICD-10-CM | POA: Diagnosis not present

## 2018-08-14 DIAGNOSIS — M5136 Other intervertebral disc degeneration, lumbar region: Secondary | ICD-10-CM

## 2018-08-14 DIAGNOSIS — M5030 Other cervical disc degeneration, unspecified cervical region: Secondary | ICD-10-CM | POA: Diagnosis not present

## 2018-08-14 DIAGNOSIS — M47812 Spondylosis without myelopathy or radiculopathy, cervical region: Secondary | ICD-10-CM

## 2018-08-14 DIAGNOSIS — F329 Major depressive disorder, single episode, unspecified: Secondary | ICD-10-CM

## 2018-08-14 DIAGNOSIS — I7300 Raynaud's syndrome without gangrene: Secondary | ICD-10-CM | POA: Diagnosis not present

## 2018-08-14 DIAGNOSIS — E559 Vitamin D deficiency, unspecified: Secondary | ICD-10-CM | POA: Diagnosis not present

## 2018-08-14 DIAGNOSIS — M51369 Other intervertebral disc degeneration, lumbar region without mention of lumbar back pain or lower extremity pain: Secondary | ICD-10-CM

## 2018-08-14 DIAGNOSIS — M5416 Radiculopathy, lumbar region: Secondary | ICD-10-CM | POA: Diagnosis not present

## 2018-08-14 DIAGNOSIS — F419 Anxiety disorder, unspecified: Secondary | ICD-10-CM

## 2018-08-14 MED ORDER — ZOLPIDEM TARTRATE 5 MG PO TABS: 2.5000 mg | ORAL_TABLET | Freq: Every evening | ORAL | 5 refills | Status: DC | PRN

## 2018-08-14 MED ORDER — BUPROPION HCL ER (SR) 150 MG PO TB12: 150.0000 mg | ORAL_TABLET | Freq: Two times a day (BID) | ORAL | 1 refills | Status: DC

## 2018-08-14 MED ORDER — GABAPENTIN 300 MG PO CAPS: 300.0000 mg | ORAL_CAPSULE | Freq: Three times a day (TID) | ORAL | 1 refills | Status: DC

## 2018-08-14 NOTE — Progress Notes (Signed)
I have discussed the procedure for the virtual visit with the patient who has given consent to proceed with assessment and treatment.   Lamario Mani, CMA     

## 2018-08-14 NOTE — Progress Notes (Signed)
VIRTUAL VISIT VIA VIDEO  I connected with MCKELL RIECKE on 08/14/18 at  9:00 AM EDT by a video enabled telemedicine application and verified that I am speaking with the correct person using two identifiers. Location patient: Home Location provider: [facility], Office Persons participating in the virtual visit: Patient, Dr. Raoul Pitch and R.Baker, LPN  I discussed the limitations of evaluation and management by telemedicine and the availability of in person appointments. The patient expressed understanding and agreed to proceed.    MARIAM HELBERT , May 11, 1950, 68 y.o., female MRN: [id number] Patient Care Team    Relationship Specialty Notifications Start End  Ma Hillock, DO PCP - General Family Medicine  12/24/14   Suella Broad, MD Consulting Physician Physical Medicine and Rehabilitation  12/31/15    Comment: pain mgmt  Megan Salon, MD Consulting Physician Gynecology  12/31/15   Richmond Campbell, MD Consulting Physician Gastroenterology  12/31/15   Wallene Huh, [place] Consulting Physician Podiatry  01/10/17   Rolm Bookbinder, MD Consulting Physician Dermatology  01/10/17   Ralene Bathe, MD Consulting Physician Ophthalmology  01/10/17   Toribio Harbour, [place] Referring Physician Chiropractic Medicine  01/10/17     Chief Complaint  Patient presents with  . Depression    Pt states that she has been trying to walk 5 miles a day - She trying to keep her head clear and find things to do. She is taking her medications as directed.   . Anxiety     Subjective:  ZURISADAI HELMINIAK is a 68 y.o. female present for f/u on [facility] Hyperlipidemia, unspecified hyperlipidemia type Reports compliance with crestor 10 mg QD.   Raynaud's disease without gangrene Reports compliance with amlodipine 2.5 mg daily.  Sleep disturbance Indication for controlled substance: Sleep disturbance/insomnia Medication and dose: Ambien 2.5 mg - 5 mg nightly # pills per month: 30 Last UDS date: Complete next  in-person visit contract signed (Y/N): Completed Date narcotic database last reviewed (include red flags): 08/14/18  Lumbar radicular pain States she has an upcoming surgery scheduled but it was postponed secondary to coronavirus pandemic.  She reports she wants to wait until a family member can be with her during her hospital stay and currently they are not allowing visitors.  She really reports gabapentin 300 mg in the a.m. and 600 mg before bed is helpful.  She also is prescribed narcotics through her pain specialist. She also complains of occasional lightheaded nose and neck popping.  Not necessarily occurring together.  She thinks she needs to start an antihistamine.  She reports she does drink fluids but thinks she may be falling behind on her water consumption in the heat.  Anxiety and depression Patient reports compliance with Wellbutrin 150 mg twice daily.  She is having some increase stress surrounding the coronavirus pandemic but overall is coping okay.  She is getting out walking to help cope with the stress.  Vitamin D deficiency Takes supplement and calcium   Depression screen [facility] 2/9 08/14/2018 01/30/2018 01/30/2018 01/10/2017 12/31/2015  Decreased Interest 2 1 0 0 1  Down, Depressed, Hopeless 0 1 0 1 3  PHQ - 2 Score 2 2 0 1 4  Altered sleeping 1 2 - 1 1  Tired, decreased energy 2 1 - 2 2  Change in appetite 0 2 - 1 0  Feeling bad or failure about yourself  0 0 - 1 2  Trouble concentrating 0 0 - 0 0  Moving slowly  or fidgety/restless 0 0 - 0 0  Suicidal thoughts 0 0 - 0 0  PHQ-9 Score 5 7 - 6 9  Difficult doing work/chores - - - Not difficult at all Somewhat difficult    Allergies  Allergen Reactions  . Codeine Other (See Comments)    Crawling skin , weird twilight dreams  . Tramadol Itching        Social History   Tobacco Use  . Smoking status: Never Smoker  . Smokeless tobacco: Never Used  Substance Use Topics  . Alcohol use: No   Past Medical History:   Diagnosis Date  . Anxiety   . Asthma   . Basal cell carcinoma   . Capsulitis of foot 05/02/2012   acute capsulitis 2nd mpj bilateral .  . Chronic headaches   . Colon polyp    tubular adenoma  . Constipation   . Depression (emotion)   . Elevated hemoglobin A1c   . Fatigue   . Frequent headaches   . Hyperlipidemia   . Hyponatremia    2012  . IBS (irritable bowel syndrome)   . Insomnia   . Neuropathy    fingers/feet  . Osteoarthritis   . Osteopenia   . Raynaud's disease /phenomenon   . Rosacea   . Tinnitus aurium, bilateral    Past Surgical History:  Procedure Laterality Date  . BREAST REDUCTION SURGERY N/A    complications of blood supply  . BUNIONECTOMY Bilateral   . CARDIAC CATHETERIZATION     06/2005  . DENTAL SURGERY    . DIGIT NAIL REMOVAL Right    fingernail removed 4th  . FACIAL COSMETIC SURGERY    . VAGINAL HYSTERECTOMY N/A    Family History  Problem Relation Age of Onset  . Dementia Mother   . Cancer Father   . Hypertension Father   . Fibromyalgia Sister   . Heart attack Maternal Aunt   . Heart attack Maternal Uncle   . Cancer Paternal Aunt   . Breast cancer Paternal Aunt   . Hypertension Sister   . Breast cancer Cousin   . Heart attack Maternal Uncle   . Rheum arthritis Paternal Grandmother    Allergies as of 08/14/2018      Reactions   Codeine Other (See Comments)   Crawling skin , weird twilight dreams   Tramadol Itching         Medication List       Accurate as of August 14, 2018  9:04 AM. If you have any questions, ask your nurse or doctor.        amLODipine 2.5 MG tablet Commonly known as: NORVASC Take 1 tablet (2.5 mg total) by mouth daily. (For Raynauds )   buPROPion 150 MG 12 hr tablet Commonly known as: WELLBUTRIN SR Take 1 tablet (150 mg total) by mouth 2 (two) times daily.   Estradiol 10 MCG Tabs vaginal tablet Commonly known as: Yuvafem Place 1 tablet (10 mcg total) vaginally 2 (two) times a week.   Finacea 15 %  cream Generic drug: Azelaic Acid   fluticasone 50 MCG/ACT nasal spray Commonly known as: Flonase Place 2 sprays into both nostrils daily.   gabapentin 300 MG capsule Commonly known as: NEURONTIN Take 1 capsule (300 mg total) by mouth 3 (three) times daily.   HYDROmorphone 2 MG tablet Commonly known as: DILAUDID   lidocaine 5 % ointment Commonly known as: XYLOCAINE Apply topically as directed no more than once a day   loratadine 10 MG  tablet Commonly known as: CLARITIN Take 10 mg by mouth daily. Pt breaks tablet into fourths and only takes one fourth.   MAGNESIUM PO Take by mouth.   methocarbamol 750 MG tablet Commonly known as: ROBAXIN Take 750 mg by mouth every 6 (six) hours as needed for muscle spasms.   NON FORMULARY Nerve Renew   Omega 3 1000 MG Caps Take by mouth.   rosuvastatin 10 MG tablet Commonly known as: CRESTOR Take 1 tablet (10 mg total) by mouth daily.   VITAMIN B 12 PO Take 1,000 mcg by mouth.   VITAMIN C PO Take by mouth.   VITAMIN E PO Take by mouth.   zolpidem 5 MG tablet Commonly known as: AMBIEN Take 0.5-1 tablets (2.5-5 mg total) by mouth at bedtime as needed for sleep.       All past medical history, surgical history, allergies, family history, immunizations andmedications were updated in the EMR today and reviewed under the history and medication portions of their EMR.     ROS: Negative, with the exception of above mentioned in HPI   Objective:  Temp 98.4 F (36.9 C)   Ht 4' 11.25" (1.505 m)   Wt 119 lb (54 kg)   BMI 23.83 kg/m  Body mass index is 23.83 kg/m. Gen: Afebrile. No acute distress.  Nontoxic HENT: AT. [place].  MMM.  Eyes:Pupils Equal Round Reactive to light, Extraocular movements intact,  Conjunctiva without redness, discharge or icterus. Chest: No cough or shortness of breath Neuro:   PERLA. EOMi. Alert. Oriented x3 Psych: Normal affect, dress and demeanor. Normal speech. Normal thought content and judgment.     No exam data present No results found. No results found for this or any previous visit (from the past 24 hour(s)).  Assessment/Plan: SHANEKIA LATELLA is a 68 y.o. female present for OV for  Hyperlipidemia, unspecified hyperlipidemia type Continue crestor 10, follow yearly at cpe Raynaud's disease without gangrene - controlled/stable.  - follow yearly at cpe- continue amlodipine  Sleep disturbance.insomnia stable [place] controlled substance database reviewed 08/14/18 Appropriate. - She is on other controlled substances supplied by other physicians.  -Ambien/controlled contract signed. Discussed dosage is 5 mg, this dose is mandated by law. She was upset she could not have the 10 mg and break into 3, instead of the 5 mg and break into 2 if needed.  - advised she will need to get script from this provider only.  - zolpidem (AMBIEN) 5 MG tablet; Take 0.5-1 tablets (2.5-5 mg total) by mouth at bedtime as needed for sleep.  Dispense: 30 tablet; Refill: 5 Lumbar radicular pain/cervical spondylosis/degenerative cervical spine/degenerative lumbar spine - scheduled for surgery- put off 2/2 to COVID- wants a family member with her.  - refills provided on gabapentin.  - gabapentin (NEURONTIN) 300 MG capsule; Take 1 capsule (300 mg total) by mouth 3 (three) times daily.  Dispense: 90 capsule; Refill: 1 Anxiety and depression -stable overall.  Refills provided on Wellbutrin. -She is staying in the house with the pandemic.  She is trying to get out and at least walk.  She is feeling some increased anxiety surrounding the condition of the world, but feels she is coping okay. - buPROPion (WELLBUTRIN SR) 150 MG 12 hr tablet; Take 1 tablet (150 mg total) by mouth 2 (two) times daily.  Dispense: 180 tablet; Refill: 1 - 6 mos.  Vitamin D deficiency Takes supplement and calcium - yearly vit d with cpe Lightheadedness: -Encouraged her to start [place] and  hydrate.  Lightheadedness has only been  occurring for last few days and is possibly secondary to the heat and mild dehydration.  Encourage her to increase her water consumption and intake one low sugar sports drink/electrolytes a day.  If this does not resolve her lightheadedness she will follow-up immediately.   Reviewed expectations re: course of current medical issues.  Discussed self-management of symptoms.  Outlined signs and symptoms indicating need for more acute intervention.  Patient verbalized understanding and all questions were answered.  Patient received an After-Visit Summary.    No orders of the defined types were placed in this encounter.  > 25 minutes spent with patient, >50% of time spent face to face    Note is dictated utilizing voice recognition software. Although note has been proof read prior to signing, occasional typographical errors still can be missed. If any questions arise, please do not hesitate to call for verification.   electronically signed by:  Howard Pouch, DO  [place]

## 2018-08-24 DIAGNOSIS — Z5181 Encounter for therapeutic drug level monitoring: Secondary | ICD-10-CM | POA: Diagnosis not present

## 2018-08-24 DIAGNOSIS — M5030 Other cervical disc degeneration, unspecified cervical region: Secondary | ICD-10-CM | POA: Diagnosis not present

## 2018-08-24 DIAGNOSIS — Z79891 Long term (current) use of opiate analgesic: Secondary | ICD-10-CM | POA: Diagnosis not present

## 2018-08-24 DIAGNOSIS — Z79899 Other long term (current) drug therapy: Secondary | ICD-10-CM | POA: Diagnosis not present

## 2018-08-24 DIAGNOSIS — M5416 Radiculopathy, lumbar region: Secondary | ICD-10-CM | POA: Diagnosis not present

## 2018-08-24 DIAGNOSIS — M5136 Other intervertebral disc degeneration, lumbar region: Secondary | ICD-10-CM | POA: Diagnosis not present

## 2018-09-03 ENCOUNTER — Other Ambulatory Visit: Payer: Self-pay | Admitting: Physical Medicine and Rehabilitation

## 2018-09-03 DIAGNOSIS — M5136 Other intervertebral disc degeneration, lumbar region: Secondary | ICD-10-CM

## 2018-09-10 ENCOUNTER — Other Ambulatory Visit: Payer: Self-pay

## 2018-09-10 ENCOUNTER — Ambulatory Visit
Admission: RE | Admit: 2018-09-10 | Discharge: 2018-09-10 | Disposition: A | Discharge status: Home / Self Care | Payer: PPO | Source: Ambulatory Visit | Attending: Physical Medicine and Rehabilitation | Admitting: Physical Medicine and Rehabilitation

## 2018-09-10 DIAGNOSIS — M5126 Other intervertebral disc displacement, lumbar region: Secondary | ICD-10-CM | POA: Diagnosis not present

## 2018-09-10 DIAGNOSIS — M5136 Other intervertebral disc degeneration, lumbar region: Secondary | ICD-10-CM

## 2018-09-10 MED ORDER — METHYLPREDNISOLONE ACETATE 40 MG/ML INJ SUSP (RADIOLOG
120.0000 mg | Freq: Once | INTRAMUSCULAR | Status: AC
  Administered 2018-09-10: 120 mg via EPIDURAL

## 2018-09-10 MED ORDER — IOPAMIDOL (ISOVUE-M 200) INJECTION 41%
1.0000 mL | Freq: Once | INTRAMUSCULAR | Status: AC
  Administered 2018-09-10: 09:00:00 1 mL via EPIDURAL

## 2018-09-10 NOTE — Discharge Instructions (Signed)

## 2019-01-08 ENCOUNTER — Encounter: Payer: Self-pay | Admitting: *Deleted

## 2019-01-10 DIAGNOSIS — Z79899 Other long term (current) drug therapy: Secondary | ICD-10-CM | POA: Diagnosis not present

## 2019-01-10 DIAGNOSIS — Z5181 Encounter for therapeutic drug level monitoring: Secondary | ICD-10-CM | POA: Diagnosis not present

## 2019-01-23 ENCOUNTER — Other Ambulatory Visit: Payer: Self-pay

## 2019-01-23 DIAGNOSIS — I7300 Raynaud's syndrome without gangrene: Secondary | ICD-10-CM

## 2019-01-23 MED ORDER — AMLODIPINE BESYLATE 2.5 MG PO TABS: 2.5000 mg | ORAL_TABLET | Freq: Every day | ORAL | 0 refills | Status: DC

## 2019-01-23 NOTE — Progress Notes (Signed)
Faxed refill request from pharmacy for pts amlodipine. Pt had last appt 07/2018 and is scheduled for next visit in 02/2018. Refilled to last until patients next appt.

## 2019-02-04 ENCOUNTER — Other Ambulatory Visit: Payer: Self-pay | Admitting: Physical Medicine and Rehabilitation

## 2019-02-04 DIAGNOSIS — M5030 Other cervical disc degeneration, unspecified cervical region: Secondary | ICD-10-CM

## 2019-02-04 DIAGNOSIS — M5136 Other intervertebral disc degeneration, lumbar region: Secondary | ICD-10-CM

## 2019-02-06 DIAGNOSIS — L918 Other hypertrophic disorders of the skin: Secondary | ICD-10-CM | POA: Diagnosis not present

## 2019-02-06 DIAGNOSIS — L438 Other lichen planus: Secondary | ICD-10-CM | POA: Diagnosis not present

## 2019-02-06 DIAGNOSIS — L819 Disorder of pigmentation, unspecified: Secondary | ICD-10-CM | POA: Diagnosis not present

## 2019-02-06 DIAGNOSIS — L718 Other rosacea: Secondary | ICD-10-CM | POA: Diagnosis not present

## 2019-02-06 DIAGNOSIS — Z85828 Personal history of other malignant neoplasm of skin: Secondary | ICD-10-CM | POA: Diagnosis not present

## 2019-02-06 DIAGNOSIS — L821 Other seborrheic keratosis: Secondary | ICD-10-CM | POA: Diagnosis not present

## 2019-02-06 DIAGNOSIS — D225 Melanocytic nevi of trunk: Secondary | ICD-10-CM | POA: Diagnosis not present

## 2019-02-06 DIAGNOSIS — D2272 Melanocytic nevi of left lower limb, including hip: Secondary | ICD-10-CM | POA: Diagnosis not present

## 2019-02-06 DIAGNOSIS — D1801 Hemangioma of skin and subcutaneous tissue: Secondary | ICD-10-CM | POA: Diagnosis not present

## 2019-02-06 DIAGNOSIS — L814 Other melanin hyperpigmentation: Secondary | ICD-10-CM | POA: Diagnosis not present

## 2019-02-06 DIAGNOSIS — D2271 Melanocytic nevi of right lower limb, including hip: Secondary | ICD-10-CM | POA: Diagnosis not present

## 2019-02-06 DIAGNOSIS — L570 Actinic keratosis: Secondary | ICD-10-CM | POA: Diagnosis not present

## 2019-02-14 DIAGNOSIS — M5136 Other intervertebral disc degeneration, lumbar region: Secondary | ICD-10-CM | POA: Diagnosis not present

## 2019-02-14 DIAGNOSIS — M5030 Other cervical disc degeneration, unspecified cervical region: Secondary | ICD-10-CM | POA: Diagnosis not present

## 2019-02-26 ENCOUNTER — Other Ambulatory Visit: Payer: Self-pay

## 2019-02-26 ENCOUNTER — Ambulatory Visit (INDEPENDENT_AMBULATORY_CARE_PROVIDER_SITE_OTHER): Payer: PPO | Admitting: Family Medicine

## 2019-02-26 ENCOUNTER — Encounter: Payer: Self-pay | Admitting: Family Medicine

## 2019-02-26 VITALS — BP 113/68 | HR 68 | Temp 97.5°F | Resp 16 | Ht 59.0 in | Wt 121.1 lb

## 2019-02-26 DIAGNOSIS — M5416 Radiculopathy, lumbar region: Secondary | ICD-10-CM | POA: Diagnosis not present

## 2019-02-26 DIAGNOSIS — R7309 Other abnormal glucose: Secondary | ICD-10-CM

## 2019-02-26 DIAGNOSIS — F329 Major depressive disorder, single episode, unspecified: Secondary | ICD-10-CM

## 2019-02-26 DIAGNOSIS — Z1231 Encounter for screening mammogram for malignant neoplasm of breast: Secondary | ICD-10-CM

## 2019-02-26 DIAGNOSIS — M5030 Other cervical disc degeneration, unspecified cervical region: Secondary | ICD-10-CM

## 2019-02-26 DIAGNOSIS — F32A Depression, unspecified: Secondary | ICD-10-CM

## 2019-02-26 DIAGNOSIS — M5136 Other intervertebral disc degeneration, lumbar region: Secondary | ICD-10-CM

## 2019-02-26 DIAGNOSIS — I7300 Raynaud's syndrome without gangrene: Secondary | ICD-10-CM | POA: Diagnosis not present

## 2019-02-26 DIAGNOSIS — E785 Hyperlipidemia, unspecified: Secondary | ICD-10-CM

## 2019-02-26 DIAGNOSIS — Z0000 Encounter for general adult medical examination without abnormal findings: Secondary | ICD-10-CM

## 2019-02-26 DIAGNOSIS — M47812 Spondylosis without myelopathy or radiculopathy, cervical region: Secondary | ICD-10-CM

## 2019-02-26 DIAGNOSIS — M4180 Other forms of scoliosis, site unspecified: Secondary | ICD-10-CM

## 2019-02-26 DIAGNOSIS — E559 Vitamin D deficiency, unspecified: Secondary | ICD-10-CM

## 2019-02-26 DIAGNOSIS — F419 Anxiety disorder, unspecified: Secondary | ICD-10-CM

## 2019-02-26 DIAGNOSIS — M51369 Other intervertebral disc degeneration, lumbar region without mention of lumbar back pain or lower extremity pain: Secondary | ICD-10-CM

## 2019-02-26 DIAGNOSIS — G479 Sleep disorder, unspecified: Secondary | ICD-10-CM | POA: Diagnosis not present

## 2019-02-26 LAB — LIPID PANEL
Cholesterol: 173 mg/dL (ref 0–200)
HDL: 59.1 mg/dL (ref >39.00)
NonHDL: 114.31
Total CHOL/HDL Ratio: 3
Triglycerides: 205 mg/dL — ABNORMAL HIGH (ref 0.0–149.0)
VLDL: 41 mg/dL — ABNORMAL HIGH (ref 0.0–40.0)

## 2019-02-26 LAB — COMPREHENSIVE METABOLIC PANEL
ALT: 17 U/L (ref 0–35)
AST: 23 U/L (ref 0–37)
Albumin: 4.4 g/dL (ref 3.5–5.2)
Alkaline Phosphatase: 61 U/L (ref 39–117)
BUN: 17 mg/dL (ref 6–23)
CO2: 28 mEq/L (ref 19–32)
Calcium: 9.9 mg/dL (ref 8.4–10.5)
Chloride: 104 mEq/L (ref 96–112)
Creatinine, Ser: 0.77 mg/dL (ref 0.40–1.20)
GFR: 73.96 mL/min (ref >60.00)
Glucose, Bld: 99 mg/dL (ref 70–99)
Potassium: 4.2 mEq/L (ref 3.5–5.1)
Sodium: 140 mEq/L (ref 135–145)
Total Bilirubin: 0.6 mg/dL (ref 0.2–1.2)
Total Protein: 6.5 g/dL (ref 6.0–8.3)

## 2019-02-26 LAB — CBC
HCT: 40.9 % (ref 36.0–46.0)
Hemoglobin: 13.7 g/dL (ref 12.0–15.0)
MCHC: 33.5 g/dL (ref 30.0–36.0)
MCV: 94.7 fl (ref 78.0–100.0)
Platelets: 267 10*3/uL (ref 150.0–400.0)
RBC: 4.32 Mil/uL (ref 3.87–5.11)
RDW: 13 % (ref 11.5–15.5)
WBC: 4.5 10*3/uL (ref 4.0–10.5)

## 2019-02-26 LAB — HEMOGLOBIN A1C: Hgb A1c MFr Bld: 5.8 % (ref 4.6–6.5)

## 2019-02-26 LAB — TSH: TSH: 3 u[IU]/mL (ref 0.35–4.50)

## 2019-02-26 LAB — LDL CHOLESTEROL, DIRECT: Direct LDL: 83 mg/dL

## 2019-02-26 MED ORDER — AMLODIPINE BESYLATE 2.5 MG PO TABS: 2.5000 mg | ORAL_TABLET | Freq: Every day | ORAL | 1 refills | Status: DC

## 2019-02-26 MED ORDER — GABAPENTIN 300 MG PO CAPS: 300.0000 mg | ORAL_CAPSULE | Freq: Three times a day (TID) | ORAL | 1 refills | Status: DC

## 2019-02-26 MED ORDER — ZOLPIDEM TARTRATE 5 MG PO TABS: 2.5000 mg | ORAL_TABLET | Freq: Every evening | ORAL | 5 refills | Status: DC | PRN

## 2019-02-26 MED ORDER — ROSUVASTATIN CALCIUM 10 MG PO TABS: 10.0000 mg | ORAL_TABLET | Freq: Every day | ORAL | 3 refills | Status: AC

## 2019-02-26 NOTE — Progress Notes (Signed)
This visit occurred during the SARS-CoV-2 public health emergency.  Safety protocols were in place, including screening questions prior to the visit, additional usage of staff PPE, and extensive cleaning of exam room while observing appropriate contact time as indicated for disinfecting solutions.    Patient ID: Nichole [name], female  DOB: [birth year], 69 y.o.   MRN: [id number] Patient Care Team    Relationship Specialty Notifications Start End  Ma Hillock, DO PCP - General Family Medicine  12/24/14   Suella Broad, MD Consulting Physician Physical Medicine and Rehabilitation  12/31/15    Comment: pain mgmt  Megan Salon, MD Consulting Physician Gynecology  12/31/15   Richmond Campbell, MD Consulting Physician Gastroenterology  12/31/15   Wallene Huh, [place] Consulting Physician Podiatry  01/10/17   Rolm Bookbinder, MD Consulting Physician Dermatology  01/10/17   Ralene Bathe, MD Consulting Physician Ophthalmology  01/10/17   Toribio Harbour, [place] Referring Physician Chiropractic Medicine  01/10/17     Chief Complaint  Patient presents with  . Annual Exam    Fasting. Mammogram 07/2018. No longer gets pap smears.     Subjective:  Nichole [name] is a 69 y.o.  Female  present for CPE. All past medical history, surgical history, allergies, family history, immunizations, medications and social history were updated in the electronic medical record today. All recent labs, ED visits and hospitalizations within the last year were reviewed.  Health maintenance:  Colonoscopy: completed 01/03/2013, 5 year recall, prior h/o polyps, Dr. Earlean Shawl she is scheduling- she is aware it is due. Mammogram: completed: 07/2018,NL, rpt yearly,completed [facility].   Cervical cancer screening:>65 not indicated,last pap: 2015, results: neg, completed by: Dr. Sabra Heck (GYN) follows with yearly.  Immunizations: tdap 05/01/2014, Influenzadeclined (encouraged yearly), PNA series completed,shingrix completed  Infectious  disease screening: Hep C completed  DEXA: last completed 02/10/2017.> normal. Complete every 10 years. Assistive device: none Oxygen YKD:XIPJ Patient has a Dental home. Hospitalizations/ED visits: reviewed  Hyperlipidemia, unspecified hyperlipidemia type Reports compliance with crestor 10 mg QD.   Omega-3 1000 mg daily  Raynaud's disease without gangrene Reports compliance with amlodipine 2.5 mg daily.  Symptoms are well controlled.  Sleep disturbance: Patient reports she is doing well on low-dose Ambien. Indication for controlled substance: Sleep disturbance/insomnia Medication and dose: Ambien 2.5 mg - 5 mg nightly # pills per month: 30 Last UDS date: Complete next in-person visit contract signed (Y/N): Completed Date narcotic database last reviewed (include red flags): 02/26/19  Lumbar radicular pain States she has an upcoming surgery scheduled but it was postponed secondary to coronavirus pandemic.    Patient reports compliance with gabapentin gabapentin 300 mg in the a.m. and 600 mg before bed.  She states the use of gabapentin is helpful.  She is also using CBD oil for her pain. She was prescribed Dilaudid through her pain specialist-however she stopped using pain meds because she did not want to complete follow-up appointments as-as required by her pain specialist.  Anxiety  Patient reports she ran out of Wellbutrin so she stopped taking it a few months ago.  She had some stress surrounding the coronavirus pandemic at the beginning- but feels she is doing well now. She is getting out walking to help cope with the stress.  Vitamin D deficiency Patient takes vit d supplement and calcium. Last bone density normal 2019. Last Vit d normal 2020.   Depression screen [facility] 2/9 02/26/2019 08/14/2018 01/30/2018 01/30/2018 01/10/2017  Decreased Interest 0 2 1 0 0  Down, Depressed, Hopeless 0 0 1 0 1  PHQ - 2 Score 0 2 2 0 1  Altered sleeping - 1 2 - 1  Tired, decreased energy - 2 1 - 2   Change in appetite - 0 2 - 1  Feeling bad or failure about yourself  - 0 0 - 1  Trouble concentrating - 0 0 - 0  Moving slowly or fidgety/restless - 0 0 - 0  Suicidal thoughts - 0 0 - 0  PHQ-9 Score - 5 7 - 6  Difficult doing work/chores - - - - Not difficult at all   GAD 7 : Generalized Anxiety Score 08/14/2018 01/30/2018  Nervous, Anxious, on Edge 0 1  Control/stop worrying 3 1  Worry too much - different things 0 0  Trouble relaxing 0 0  Restless 0 0  Easily annoyed or irritable 0 1  Afraid - awful might happen 0 0  Total GAD 7 Score 3 3    Immunization History  Administered Date(s) Administered  . Pneumococcal Conjugate-13 12/31/2015  . Pneumococcal Polysaccharide-23 05/01/2014  . Tdap 11/17/2011, 05/01/2014  . Zoster 11/16/2009  . Zoster Recombinat (Shingrix) 07/31/2017, 02/02/2018    Past Medical History:  Diagnosis Date  . Anxiety   . Asthma   . Basal cell carcinoma   . Capsulitis of foot 05/02/2012   acute capsulitis 2nd mpj bilateral .  . Chronic headaches   . Colon polyp    tubular adenoma  . Constipation   . Depression (emotion)   . Elevated hemoglobin A1c   . Fatigue   . Frequent headaches   . Hyperlipidemia   . Hyponatremia    2012  . IBS (irritable bowel syndrome)   . Insomnia   . Neuropathy    fingers/feet  . Osteoarthritis   . Osteopenia   . Raynaud's disease /phenomenon   . Rosacea   . Tinnitus aurium, bilateral    Allergies  Allergen Reactions  . Codeine Other (See Comments)    Crawling skin , weird twilight dreams  . Tramadol Itching        Past Surgical History:  Procedure Laterality Date  . BREAST REDUCTION SURGERY N/A    complications of blood supply  . BUNIONECTOMY Bilateral   . CARDIAC CATHETERIZATION     06/2005  . DENTAL SURGERY    . DIGIT NAIL REMOVAL Right    fingernail removed 4th  . FACIAL COSMETIC SURGERY    . VAGINAL HYSTERECTOMY N/A    Family History  Problem Relation Age of Onset  . Dementia Mother   .  Cancer Father   . Hypertension Father   . Fibromyalgia Sister   . Heart attack Maternal Aunt   . Heart attack Maternal Uncle   . Cancer Paternal Aunt   . Breast cancer Paternal Aunt   . Hypertension Sister   . Breast cancer Cousin   . Heart attack Maternal Uncle   . Rheum arthritis Paternal Grandmother    Social History   Social History Narrative  . Not on file    Allergies as of 02/26/2019      Reactions   Codeine Other (See Comments)   Crawling skin , weird twilight dreams   Tramadol Itching         Medication List       Accurate as of February 26, 2019  6:08 PM. If you have any questions, ask your nurse or doctor.        STOP taking these medications  buPROPion 150 MG 12 hr tablet Commonly known as: WELLBUTRIN SR Stopped by: Howard Pouch, DO   HYDROmorphone 2 MG tablet Commonly known as: DILAUDID Stopped by: Howard Pouch, DO     TAKE these medications   amLODipine 2.5 MG tablet Commonly known as: NORVASC Take 1 tablet (2.5 mg total) by mouth daily. (For Raynauds )   Estradiol 10 MCG Tabs vaginal tablet Commonly known as: Yuvafem Place 1 tablet (10 mcg total) vaginally 2 (two) times a week.   Finacea 15 % cream Generic drug: Azelaic Acid   fluticasone 50 MCG/ACT nasal spray Commonly known as: Flonase Place 2 sprays into both nostrils daily.   gabapentin 300 MG capsule Commonly known as: NEURONTIN Take 1 capsule (300 mg total) by mouth 3 (three) times daily.   Krill Oil 500 MG Caps Take by mouth.   lidocaine 5 % ointment Commonly known as: XYLOCAINE Apply topically as directed no more than once a day   MAGNESIUM PO Take by mouth.   methocarbamol 750 MG tablet Commonly known as: ROBAXIN Take 750 mg by mouth every 6 (six) hours as needed for muscle spasms.   NON FORMULARY Nerve Renew   Omega 3 1000 MG Caps Take by mouth.   rosuvastatin 10 MG tablet Commonly known as: CRESTOR Take 1 tablet (10 mg total) by mouth daily.   Shingrix  injection Generic drug: Zoster Vaccine Adjuvanted Shingrix (PF) 50 mcg/0.5 mL intramuscular suspension, kit   VITAMIN B 12 PO Take 1,000 mcg by mouth.   VITAMIN C PO Take by mouth.   VITAMIN E PO Take by mouth.   zolpidem 5 MG tablet Commonly known as: AMBIEN Take 0.5-1 tablets (2.5-5 mg total) by mouth at bedtime as needed for sleep.       All past medical history, surgical history, allergies, family history, immunizations andmedications were updated in the EMR today and reviewed under the history and medication portions of their EMR.     Recent Results (from the past 2160 hour(s))  CBC     Status: None   Collection Time: 02/26/19  9:42 AM  Result Value Ref Range   WBC 4.5 4.0 - 10.5 K/uL   RBC 4.32 3.87 - 5.11 Mil/uL   Platelets 267.0 150.0 - 400.0 K/uL   Hemoglobin 13.7 12.0 - 15.0 g/dL   HCT 40.9 36.0 - 46.0 %   MCV 94.7 78.0 - 100.0 fl   MCHC 33.5 30.0 - 36.0 g/dL   RDW 13.0 11.5 - 15.5 %  Comprehensive metabolic panel     Status: None   Collection Time: 02/26/19  9:42 AM  Result Value Ref Range   Sodium 140 135 - 145 mEq/L   Potassium 4.2 3.5 - 5.1 mEq/L   Chloride 104 96 - 112 mEq/L   CO2 28 19 - 32 mEq/L   Glucose, Bld 99 70 - 99 mg/dL   BUN 17 6 - 23 mg/dL   Creatinine, Ser 0.77 0.40 - 1.20 mg/dL   Total Bilirubin 0.6 0.2 - 1.2 mg/dL   Alkaline Phosphatase 61 39 - 117 U/L   AST 23 0 - 37 U/L   ALT 17 0 - 35 U/L   Total Protein 6.5 6.0 - 8.3 g/dL   Albumin 4.4 3.5 - 5.2 g/dL   GFR 73.96 >60.00 mL/min   Calcium 9.9 8.4 - 10.5 mg/dL  Hemoglobin A1c     Status: None   Collection Time: 02/26/19  9:42 AM  Result Value Ref Range   Hgb  A1c MFr Bld 5.8 4.6 - 6.5 %    Comment: Glycemic Control Guidelines for People with Diabetes:Non Diabetic:  <6%Goal of Therapy: <7%Additional Action Suggested:  >8%   Lipid panel     Status: Abnormal   Collection Time: 02/26/19  9:42 AM  Result Value Ref Range   Cholesterol 173 0 - 200 mg/dL    Comment: ATP III  Classification       Desirable:  < 200 mg/dL               Borderline High:  200 - 239 mg/dL          High:  > = 240 mg/dL   Triglycerides 205.0 (H) 0.0 - 149.0 mg/dL    Comment: Normal:  <150 mg/dLBorderline High:  150 - 199 mg/dL   HDL 59.10 >39.00 mg/dL   VLDL 41.0 (H) 0.0 - 40.0 mg/dL   Total CHOL/HDL Ratio 3     Comment:                Men          Women1/2 Average Risk     3.4          3.3Average Risk          5.0          4.42X Average Risk          9.6          7.13X Average Risk          15.0          11.0                       NonHDL 114.31     Comment: NOTE:  Non-HDL goal should be 30 mg/dL higher than patient's LDL goal (i.e. LDL goal of < 70 mg/dL, would have non-HDL goal of < 100 mg/dL)  TSH     Status: None   Collection Time: 02/26/19  9:42 AM  Result Value Ref Range   TSH 3.00 0.35 - 4.50 uIU/mL  LDL cholesterol, direct     Status: None   Collection Time: 02/26/19  9:42 AM  Result Value Ref Range   Direct LDL 83.0 mg/dL    Comment: Optimal:  <100 mg/dLNear or Above Optimal:  100-129 mg/dLBorderline High:  130-159 mg/dLHigh:  160-189 mg/dLVery High:  >190 mg/dL    DG INJECT DIAG/THERA/INC NEEDLE/CATH/PLC EPI/LUMB/SAC W/IMG Result Date: 09/10/2018 IMPRESSION: Technically successful epidural injection on the right L2-3 # 1 Electronically Signed   By: San Morelle M.D.   On: 09/10/2018 09:39   ROS: 14 pt review of systems performed and negative (unless mentioned in an HPI)  Objective: BP 113/68 (BP Location: Right Arm, Patient Position: Sitting, Cuff Size: Normal)   Pulse 68   Temp (!) 97.5 F (36.4 C) (Temporal)   Resp 16   Ht '4\' 11"'  (1.499 m)   Wt 121 lb 2 oz (54.9 kg)   SpO2 98%   BMI 24.46 kg/m  Gen: Afebrile. No acute distress. Nontoxic in appearance, well-developed, well-nourished, Caucasian female. HENT: AT. . Bilateral TM visualized and normal in appearance, normal external auditory canal. MMM, no oral lesions, adequate dentition. Bilateral nares  within normal limits. Throat without erythema, ulcerations or exudates.  No cough on exam, no hoarseness on exam. Eyes:Pupils Equal Round Reactive to light, Extraocular movements intact,  Conjunctiva without redness, discharge or icterus. Neck/lymp/endocrine: Supple, no lymphadenopathy, no thyromegaly CV: RRR no  murmur, no edema, +2/4 P posterior tibialis pulses.  Chest: CTAB, no wheeze, rhonchi or crackles.  Normal respiratory effort.  Good air movement. Abd: Soft.  Flat. NTND. BS present.  No masses palpated. No hepatosplenomegaly. No rebound tenderness or guarding.  Mild stool burden left lower quadrant present. Skin: No rashes, purpura or petechiae. Warm and well-perfused. Skin intact. Neuro/Msk:  Normal gait. PERLA. EOMi. Alert. Oriented x3.  Cranial nerves II through XII intact. Muscle strength 5/5 upper/lower extremity. DTRs equal bilaterally. Psych: Normal affect, dress and demeanor. Normal speech. Normal thought content and judgment.   No exam data present  Assessment/plan: Nichole [name] is a 69 y.o. female present for CPE Hyperlipidemia, unspecified hyperlipidemia type Continue crestor 10, follow yearly at cpe Continue omega-3 supplementation.  Currently taking omega-3 1000 mg daily Raynaud's disease without gangrene -Stable.  Continue amlodipine 2.5 mg daily - follow yearly at cpe Sleep disturbance.insomnia Stable. [place] controlled substance database reviewed 02/26/19 -Ambien/controlled contract signed. Discussed dosage is 5 mg, this dose is mandated by law. She was upset she could not have the 10 mg and break into 3, instead of the 5 mg and break into 2 if needed.  - advised she will need to get script from this provider only.  - zolpidem (AMBIEN) 5 MG tablet; Take 0.5-1 tablets (2.5-5 mg total) by mouth at bedtime as needed for sleep. Dispense: 30 tablet; Refill: 5 Lumbar radicular pain/cervical spondylosis/degenerative cervical spine/degenerative lumbar spine -She is  no longer on prescribed pain medications through her pain specialist.  She is using CBD oil to control her pain. - scheduled for surgery- put off 2/2 to COVID- wants a family member with her.  -Continue gabapentin 300/600   Anxiety and depression Resolved.  Stopped Wellbutrin on her own and doing well.   Vitamin D deficiency Takes supplement and calcium - declines recheck today.Her Vitd has maintained her levels with supplement. Breast cancer screening: 3D mammogram ordered today for mid July scheduling at [facility]. Preventive Health Exam:  Patient was encouraged to exercise greater than 150 minutes a week. Patient was encouraged to choose a diet filled with fresh fruits and vegetables, and lean meats. AVS provided to patient today for education/recommendation on gender specific health and safety maintenance. Colonoscopy: completed 01/03/2013, 5 year recall, prior h/o polyps, Dr. Earlean Shawl she is scheduling- she is aware it is due. Mammogram: completed: 07/2018,NL, rpt yearly,completed [facility].    Order placed today. Cervical cancer screening:>65 not indicated,last pap: 2015, results: neg, completed by: Dr. Sabra Heck (GYN) follows with yearly.  Immunizations: tdap 05/01/2014, Influenzadeclined (encouraged yearly), PNA series completed,shingrix completed. Educated on COVID vaccine Infectious disease screening: Hep C completed  DEXA: last completed 02/10/2017.> normal. Complete every 10 years.  Follow-up 5 and half months on chronic medical conditions. Follow-up yearly for [place] combination visit  Orders Placed This Encounter  Procedures  . MM 3D SCREEN BREAST BILATERAL  . CBC  . Comprehensive metabolic panel  . Hemoglobin A1c  . Lipid panel  . TSH  . LDL cholesterol, direct   Meds ordered this encounter  Medications  . amLODipine (NORVASC) 2.5 MG tablet    Sig: Take 1 tablet (2.5 mg total) by mouth daily. (For Raynauds )    Dispense:  90 tablet    Refill:  1  . gabapentin  (NEURONTIN) 300 MG capsule    Sig: Take 1 capsule (300 mg total) by mouth 3 (three) times daily.    Dispense:  270 capsule    Refill:  1  Do not fill until requested  . rosuvastatin (CRESTOR) 10 MG tablet    Sig: Take 1 tablet (10 mg total) by mouth daily.    Dispense:  90 tablet    Refill:  3  . zolpidem (AMBIEN) 5 MG tablet    Sig: Take 0.5-1 tablets (2.5-5 mg total) by mouth at bedtime as needed for sleep.    Dispense:  30 tablet    Refill:  5   Referral Orders  No referral(s) requested today     Electronically signed by: Howard Pouch, [place]

## 2019-02-26 NOTE — Patient Instructions (Addendum)
COVID-19 Vaccine Information can be found at: [email or web address] For questions related to vaccine distribution or appointments, please email [email or web address] or call [phone].  Covid Vaccine appointment go to [email or web address].   I have placed the order for your mammogram- due mid July.    Health Maintenance, Female Adopting a healthy lifestyle and getting preventive care are important in promoting health and wellness. Ask your health care provider about:  The right schedule for you to have regular tests and exams.  Things you can do on your own to prevent diseases and keep yourself healthy. What should I know about diet, weight, and exercise? Eat a healthy diet   Eat a diet that includes plenty of vegetables, fruits, low-fat dairy products, and lean protein.  Do not eat a lot of foods that are high in solid fats, added sugars, or sodium. Maintain a healthy weight Body mass index (BMI) is used to identify weight problems. It estimates body fat based on height and weight. Your health care provider can help determine your BMI and help you achieve or maintain a healthy weight. Get regular exercise Get regular exercise. This is one of the most important things you can do for your health. Most adults should:  Exercise for at least 150 minutes each week. The exercise should increase your heart rate and make you sweat (moderate-intensity exercise).  Do strengthening exercises at least twice a week. This is in addition to the moderate-intensity exercise.  Spend less time sitting. Even light physical activity can be beneficial. Watch cholesterol and blood lipids Have your blood tested for lipids and cholesterol at 71 years of age, then have this test every 5 years. Have your cholesterol levels checked more often if:  Your lipid or cholesterol levels are high.  You are older than 71 years of age.  You are at high  risk for heart disease. What should I know about cancer screening? Depending on your health history and family history, you may need to have cancer screening at various ages. This may include screening for:  Breast cancer.  Cervical cancer.  Colorectal cancer.  Skin cancer.  Lung cancer. What should I know about heart disease, diabetes, and high blood pressure? Blood pressure and heart disease  High blood pressure causes heart disease and increases the risk of stroke. This is more likely to develop in people who have high blood pressure readings, are of African descent, or are overweight.  Have your blood pressure checked: ? Every 3-5 years if you are 61-28 years of age. ? Every year if you are 18 years old or older. Diabetes Have regular diabetes screenings. This checks your fasting blood sugar level. Have the screening done:  Once every three years after age 22 if you are at a normal weight and have a low risk for diabetes.  More often and at a younger age if you are overweight or have a high risk for diabetes. What should I know about preventing infection? Hepatitis B If you have a higher risk for hepatitis B, you should be screened for this virus. Talk with your health care provider to find out if you are at risk for hepatitis B infection. Hepatitis C Testing is recommended for:  Everyone born from [birth year] through 1967.  Anyone with known risk factors for hepatitis C. Sexually transmitted infections (STIs)  Get screened for STIs, including gonorrhea and chlamydia, if: ? You are sexually active and are younger than 71 years of age. ? You are older than 71 years of  age and your health care provider tells you that you are at risk for this type of infection. ? Your sexual activity has changed since you were last screened, and you are at increased risk for chlamydia or gonorrhea. Ask your health care provider if you are at risk.  Ask your health care provider about whether you  are at high risk for HIV. Your health care provider may recommend a prescription medicine to help prevent HIV infection. If you choose to take medicine to prevent HIV, you should first get tested for HIV. You should then be tested every 3 months for as long as you are taking the medicine. Pregnancy  If you are about to stop having your period (premenopausal) and you may become pregnant, seek counseling before you get pregnant.  Take 400 to 800 micrograms (mcg) of folic acid every day if you become pregnant.  Ask for birth control (contraception) if you want to prevent pregnancy. Osteoporosis and menopause Osteoporosis is a disease in which the bones lose minerals and strength with aging. This can result in bone fractures. If you are 67 years old or older, or if you are at risk for osteoporosis and fractures, ask your health care provider if you should:  Be screened for bone loss.  Take a calcium or vitamin D supplement to lower your risk of fractures.  Be given hormone replacement therapy (HRT) to treat symptoms of menopause. Follow these instructions at home: Lifestyle  Do not use any products that contain nicotine or tobacco, such as cigarettes, e-cigarettes, and chewing tobacco. If you need help quitting, ask your health care provider.  Do not use street drugs.  Do not share needles.  Ask your health care provider for help if you need support or information about quitting drugs. Alcohol use  Do not drink alcohol if: ? Your health care provider tells you not to drink. ? You are pregnant, may be pregnant, or are planning to become pregnant.  If you drink alcohol: ? Limit how much you use to 0-1 drink a day. ? Limit intake if you are breastfeeding.  Be aware of how much alcohol is in your drink. In the [place] one drink equals one 12 oz bottle of beer (355 mL), one 5 oz glass of wine (148 mL), or one 1 oz glass of hard liquor (44 mL). General instructions  Schedule regular  health, dental, and eye exams.  Stay current with your vaccines.  Tell your health care provider if: ? You often feel depressed. ? You have ever been abused or do not feel safe at home. Summary  Adopting a healthy lifestyle and getting preventive care are important in promoting health and wellness.  Follow your health care provider's instructions about healthy diet, exercising, and getting tested or screened for diseases.  Follow your health care provider's instructions on monitoring your cholesterol and blood pressure. This information is not intended to replace advice given to you by your health care provider. Make sure you discuss any questions you have with your health care provider. Document Revised: 01/03/2018 Document Reviewed: 01/03/2018 Elsevier Patient Education  2020 [employer].

## 2019-03-28 ENCOUNTER — Telehealth: Payer: Self-pay

## 2019-03-28 NOTE — Telephone Encounter (Signed)
Patient left a VM that she needed an Rx. Patient did not state which one

## 2019-03-28 NOTE — Telephone Encounter (Signed)
Pt was called again with going straight to VM and unable to leave VM. Sent my chart message

## 2019-03-28 NOTE — Telephone Encounter (Signed)
Pt states she has not been able to refill her Ambien. She tried the automated system and they have not called her to tell her it is ready. Pt was advised that it was sent to pharmacy on 02/26/19 and she may have to call and speak with pharmacy staff as it is a controlled med. Pt stated she had to stay on hold too long. Pharmacy was called by me and Merrilee Seashore said it was there and pt needed to call when she needed a refill as controlled meds are not refilled automatically. Pt was called back and it went straight to VM and was unable to leave message.

## 2019-05-09 ENCOUNTER — Other Ambulatory Visit: Payer: Self-pay | Admitting: Obstetrics & Gynecology

## 2019-05-09 NOTE — Telephone Encounter (Signed)
Medication refill request: Vagifem #[id number], R0 Last AEX:  02-14-18 Next AEX: 06-14-19 Last MMG (if hormonal medication request): 08-07-18 Neg/[id number] Refill authorized: please refill if appropriate

## 2019-06-03 ENCOUNTER — Other Ambulatory Visit: Payer: Self-pay | Admitting: Physical Medicine and Rehabilitation

## 2019-06-03 DIAGNOSIS — M5136 Other intervertebral disc degeneration, lumbar region: Secondary | ICD-10-CM

## 2019-06-03 DIAGNOSIS — M5030 Other cervical disc degeneration, unspecified cervical region: Secondary | ICD-10-CM

## 2019-06-11 ENCOUNTER — Other Ambulatory Visit: Payer: Self-pay

## 2019-06-11 ENCOUNTER — Ambulatory Visit
Admission: RE | Admit: 2019-06-11 | Discharge: 2019-06-11 | Disposition: A | Discharge status: Home / Self Care | Payer: PPO | Source: Ambulatory Visit | Attending: Physical Medicine and Rehabilitation | Admitting: Physical Medicine and Rehabilitation

## 2019-06-11 DIAGNOSIS — M48061 Spinal stenosis, lumbar region without neurogenic claudication: Secondary | ICD-10-CM | POA: Diagnosis not present

## 2019-06-11 DIAGNOSIS — M5136 Other intervertebral disc degeneration, lumbar region: Secondary | ICD-10-CM

## 2019-06-11 MED ORDER — IOPAMIDOL (ISOVUE-M 200) INJECTION 41%
1.0000 mL | Freq: Once | INTRAMUSCULAR | Status: AC
  Administered 2019-06-11: 1 mL via EPIDURAL

## 2019-06-11 MED ORDER — METHYLPREDNISOLONE ACETATE 40 MG/ML INJ SUSP (RADIOLOG
120.0000 mg | Freq: Once | INTRAMUSCULAR | Status: AC
  Administered 2019-06-11: 120 mg via EPIDURAL

## 2019-06-11 NOTE — Discharge Instructions (Signed)

## 2019-06-14 ENCOUNTER — Encounter: Payer: Self-pay | Admitting: Obstetrics & Gynecology

## 2019-06-14 ENCOUNTER — Other Ambulatory Visit: Payer: Self-pay

## 2019-06-14 ENCOUNTER — Ambulatory Visit (INDEPENDENT_AMBULATORY_CARE_PROVIDER_SITE_OTHER): Payer: PPO | Admitting: Obstetrics & Gynecology

## 2019-06-14 VITALS — BP 110/60 | HR 68 | Temp 96.8°F | Ht 59.0 in | Wt 123.0 lb

## 2019-06-14 DIAGNOSIS — Z01419 Encounter for gynecological examination (general) (routine) without abnormal findings: Secondary | ICD-10-CM

## 2019-06-14 MED ORDER — ESTRADIOL 10 MCG VA TABS: 1.0000 | ORAL_TABLET | VAGINAL | 4 refills | Status: DC

## 2019-06-14 NOTE — Progress Notes (Signed)
69 y.o. G2P0202 Married White or Caucasian female here for annual exam.  Had back surgery scheduled last year but was cancelled due to Covid.  Has been doing PT and had injection on Tuesday that really seems to have helped.    Denies vaginal bleeding.        Has not gotten the Covid vaccination.    No LMP recorded. Patient has had a hysterectomy.          Sexually active: Yes.    The current method of family planning is status post hysterectomy.    Exercising: Yes.    walking Smoker:  no  Health Maintenance: Pap:  2005 Negative History of abnormal Pap:  no MMG:  08/07/18 BIRADS 2 benign/density a Colonoscopy:  01/03/13 normal. F/u 5 years.  Pt aware this is due.  States she will call BMD:   02/10/17 Normal TDaP:  2016 Pneumonia vaccine(s):  completed Shingrix:   completed Hep C testing: 12/24/14 negative Screening Labs: PCP   reports that she has never smoked. She has never used smokeless tobacco. She reports that she does not drink alcohol or use drugs.  Past Medical History:  Diagnosis Date  . Anxiety   . Asthma   . Basal cell carcinoma   . Capsulitis of foot 05/02/2012   acute capsulitis 2nd mpj bilateral .  . Chronic headaches   . Colon polyp    tubular adenoma  . Constipation   . DDD (degenerative disc disease), lumbar   . Depression (emotion)   . Elevated hemoglobin A1c   . Fatigue   . Frequent headaches   . Hyperlipidemia   . Hyponatremia    2012  . IBS (irritable bowel syndrome)   . Insomnia   . Neuropathy    fingers/feet  . Osteoarthritis   . Osteopenia   . Raynaud's disease /phenomenon   . Rosacea   . Tinnitus aurium, bilateral     Past Surgical History:  Procedure Laterality Date  . BREAST REDUCTION SURGERY N/A    complications of blood supply  . BUNIONECTOMY Bilateral   . CARDIAC CATHETERIZATION     06/2005  . DENTAL SURGERY    . DIGIT NAIL REMOVAL Right    fingernail removed 4th  . FACIAL COSMETIC SURGERY    . VAGINAL HYSTERECTOMY N/A      Current Outpatient Medications  Medication Sig Dispense Refill  . amLODipine (NORVASC) 2.5 MG tablet Take 1 tablet (2.5 mg total) by mouth daily. (For Raynauds ) 90 tablet 1  . Ascorbic Acid (VITAMIN C PO) Take by mouth.    . Cholecalciferol (D-3-5) 125 MCG (5000 UT) capsule     . Cyanocobalamin (VITAMIN B 12 PO) Take 1,000 mcg by mouth.    . Estradiol (YUVAFEM) 10 MCG TABS vaginal tablet Place 1 tablet (10 mcg total) vaginally 2 (two) times a week. 24 tablet 4  . fluticasone (FLONASE) 50 MCG/ACT nasal spray Place 2 sprays into both nostrils daily. 16 g 2  . gabapentin (NEURONTIN) 300 MG capsule Take 1 capsule (300 mg total) by mouth 3 (three) times daily. 270 capsule 1  . KRILL OIL PO Take 5,000 mg by mouth daily.     Marland Kitchen lidocaine (XYLOCAINE) 5 % ointment Apply topically as directed no more than once a day 35 g 0  . MAGNESIUM PO Take by mouth.    . methocarbamol (ROBAXIN) 750 MG tablet Take 750 mg by mouth every 6 (six) hours as needed for muscle spasms.    Marland Kitchen  NON FORMULARY Nerve Renew    . rosuvastatin (CRESTOR) 10 MG tablet Take 1 tablet (10 mg total) by mouth daily. 90 tablet 3  . VITAMIN E PO Take by mouth.    . Zinc 50 MG CAPS     . zolpidem (AMBIEN) 5 MG tablet Take 0.5-1 tablets (2.5-5 mg total) by mouth at bedtime as needed for sleep. 30 tablet 5   No current facility-administered medications for this visit.    Family History  Problem Relation Age of Onset  . Dementia Mother   . Cancer Father   . Hypertension Father   . Fibromyalgia Sister   . Heart attack Maternal Aunt   . Heart attack Maternal Uncle   . Cancer Paternal Aunt   . Breast cancer Paternal Aunt   . Hypertension Sister   . Breast cancer Cousin   . Heart attack Maternal Uncle   . Rheum arthritis Paternal Grandmother     Review of Systems  All other systems reviewed and are negative.   Exam:   BP 110/60 (BP Location: Right Arm, Patient Position: Sitting, Cuff Size: Normal)   Pulse 68   Temp (!) 96.8  F (36 C) (Temporal)   Ht 4\' 11"  (1.499 m)   Wt 123 lb (55.8 kg)   BMI 24.84 kg/m   Height: 4\' 11"  (149.9 cm)  General appearance: alert, cooperative and appears stated age Head: Normocephalic, without obvious abnormality, atraumatic Neck: no adenopathy, supple, symmetrical, trachea midline and thyroid normal to inspection and palpation Lungs: clear to auscultation bilaterally Breasts: normal appearance, no masses or tenderness Heart: regular rate and rhythm Abdomen: soft, non-tender; bowel sounds normal; no masses,  no organomegaly Extremities: extremities normal, atraumatic, no cyanosis or edema Skin: Skin color, texture, turgor normal. No rashes or lesions Lymph nodes: Cervical, supraclavicular, and axillary nodes normal. No abnormal inguinal nodes palpated Neurologic: Grossly normal   Pelvic: External genitalia:  no lesions              Urethra:  normal appearing urethra with no masses, tenderness or lesions              Bartholins and Skenes: normal                 Vagina: normal appearing vagina with normal color and discharge, no lesions              Cervix: absent              Pap taken: No. Bimanual Exam:  Uterus:  uterus absent              Adnexa: no mass, fullness, tenderness               Rectovaginal: Confirms               Anus:  normal sphincter tone, no lesions  Chaperone, Terence Lux, CMA, was present for exam.  A:  Well Woman with normal exam PMP, no HRT H/o breast reduction with complications, does not have nipples H/o depression/anxiety H/o TVH, ovaries remain Vaginal atrophy on vagifem Elevated triglycerides  P:   Mammogram guidelines reviewed.  Doing yearly. pap smear not indicated Vaccines UTD except Covid vaccination Blood work done with Dr. Raoul Pitch in Feb, 2021 RF for vagifem 68meq pv twice weekly.  #24/4RF Return annually or prn

## 2019-06-25 ENCOUNTER — Other Ambulatory Visit: Payer: Self-pay

## 2019-06-25 ENCOUNTER — Ambulatory Visit
Admission: RE | Admit: 2019-06-25 | Discharge: 2019-06-25 | Disposition: A | Discharge status: Home / Self Care | Payer: PPO | Source: Ambulatory Visit | Attending: Physical Medicine and Rehabilitation | Admitting: Physical Medicine and Rehabilitation

## 2019-06-25 DIAGNOSIS — M5030 Other cervical disc degeneration, unspecified cervical region: Secondary | ICD-10-CM

## 2019-06-25 DIAGNOSIS — M542 Cervicalgia: Secondary | ICD-10-CM | POA: Diagnosis not present

## 2019-06-25 MED ORDER — TRIAMCINOLONE ACETONIDE 40 MG/ML IJ SUSP (RADIOLOGY)
60.0000 mg | Freq: Once | INTRAMUSCULAR | Status: AC
  Administered 2019-06-25: 60 mg via EPIDURAL

## 2019-06-25 MED ORDER — IOPAMIDOL (ISOVUE-M 300) INJECTION 61%
1.0000 mL | Freq: Once | INTRAMUSCULAR | Status: AC
  Administered 2019-06-25: 1 mL via EPIDURAL

## 2019-06-25 NOTE — Discharge Instructions (Signed)

## 2019-08-27 ENCOUNTER — Encounter: Payer: Self-pay | Admitting: Family Medicine

## 2019-08-27 ENCOUNTER — Other Ambulatory Visit: Payer: Self-pay

## 2019-08-27 ENCOUNTER — Telehealth (INDEPENDENT_AMBULATORY_CARE_PROVIDER_SITE_OTHER): Payer: PPO | Admitting: Family Medicine

## 2019-08-27 DIAGNOSIS — G479 Sleep disorder, unspecified: Secondary | ICD-10-CM | POA: Diagnosis not present

## 2019-08-27 DIAGNOSIS — I7300 Raynaud's syndrome without gangrene: Secondary | ICD-10-CM

## 2019-08-27 DIAGNOSIS — M5416 Radiculopathy, lumbar region: Secondary | ICD-10-CM

## 2019-08-27 MED ORDER — AMLODIPINE BESYLATE 2.5 MG PO TABS: 2.5000 mg | ORAL_TABLET | Freq: Every day | ORAL | 1 refills | Status: AC

## 2019-08-27 MED ORDER — ZOLPIDEM TARTRATE 5 MG PO TABS: 2.5000 mg | ORAL_TABLET | Freq: Every evening | ORAL | 5 refills | Status: AC | PRN

## 2019-08-27 MED ORDER — GABAPENTIN 300 MG PO CAPS: 300.0000 mg | ORAL_CAPSULE | Freq: Three times a day (TID) | ORAL | 1 refills | Status: AC

## 2019-08-27 NOTE — Progress Notes (Signed)
VIRTUAL VISIT VIA VIDEO  I connected with [name] on 08/27/19 at  2:30 PM EDT by a video enabled telemedicine application and verified that I am speaking with the correct person using two identifiers. Location patient: Home Location provider: [facility], Office Persons participating in the virtual visit: Patient, Dr. Raoul Pitch and R.Baker, LPN  I discussed the limitations of evaluation and management by telemedicine and the availability of in person appointments. The patient expressed understanding and agreed to proceed.   Patient ID: Nichole [name], female  DOB: Jan 08, 1951, 69 y.o.   MRN: [id number] Patient Care Team    Relationship Specialty Notifications Start End  Ma Hillock, DO PCP - General Family Medicine  12/24/14   Suella Broad, MD Consulting Physician Physical Medicine and Rehabilitation  12/31/15    Comment: pain mgmt  Megan Salon, MD Consulting Physician Gynecology  12/31/15   Richmond Campbell, MD Consulting Physician Gastroenterology  12/31/15   Wallene Huh, [place] Consulting Physician Podiatry  01/10/17   Rolm Bookbinder, MD Consulting Physician Dermatology  01/10/17   Ralene Bathe, MD Consulting Physician Ophthalmology  01/10/17   Toribio Harbour, [place] Referring Physician Chiropractic Medicine  01/10/17     Chief Complaint  Patient presents with  . 6 month follow-up    follow-up on medications    Subjective:  Nichole [name] is a 69 y.o.  Female  present for  Hyperlipidemia, unspecified hyperlipidemia type Reports compliance  with crestor 10 mg QD.   Omega-3 1000 mg daily. She is walking 6 miles a few times a week and has joined a senior exercise class 3x week.   Raynaud's disease without gangrene Reports compliance with amlodipine 2.5 mg daily.  Symptoms are well controlled  Sleep disturbance: Patient reports she is doing well on low-dose Ambien. Indication for controlled substance: Sleep disturbance/insomnia Medication and dose: Ambien 2.5 mg - 5  mg nightly # pills per month: 30 Last UDS date: Complete next in-person visit contract signed (Y/N): Completed Date narcotic database last reviewed (include red flags): 08/27/19  Lumbar radicular pain Patient reports compliance  with gabapentin gabapentin 300 mg in the a.m. and 600 mg before bed.  She states the use of gabapentin is helpful.  She is also using CBD oil for her pain. She was prescribed Dilaudid through her pain specialist-however she stopped using pain meds since her surgery.   Vitamin D deficiency Patient takes vit d supplement and calcium. Last bone density normal 2019. Last Vit d normal 2020.   Depression screen [facility] 2/9 02/26/2019 08/14/2018 01/30/2018 01/30/2018 01/10/2017  Decreased Interest 0 2 1 0 0  Down, Depressed, Hopeless 0 0 1 0 1  PHQ - 2 Score 0 2 2 0 1  Altered sleeping - 1 2 - 1  Tired, decreased energy - 2 1 - 2  Change in appetite - 0 2 - 1  Feeling bad or failure about yourself  - 0 0 - 1  Trouble concentrating - 0 0 - 0  Moving slowly or fidgety/restless - 0 0 - 0  Suicidal thoughts - 0 0 - 0  PHQ-9 Score - 5 7 - 6  Difficult doing work/chores - - - - Not difficult at all   GAD 7 : Generalized Anxiety Score 08/14/2018 01/30/2018  Nervous, Anxious, on Edge 0 1  Control/stop worrying 3 1  Worry too much - different things 0 0  Trouble relaxing 0 0  Restless 0 0  Easily  annoyed or irritable 0 1  Afraid - awful might happen 0 0  Total GAD 7 Score 3 3    Immunization History  Administered Date(s) Administered  . Pneumococcal Conjugate-13 12/31/2015  . Pneumococcal Polysaccharide-23 05/01/2014  . Tdap 11/17/2011, 05/01/2014  . Zoster 11/16/2009  . Zoster Recombinat (Shingrix) 07/31/2017, 02/02/2018    Past Medical History:  Diagnosis Date  . Anxiety   . Asthma   . Basal cell carcinoma   . Capsulitis of foot 05/02/2012   acute capsulitis 2nd mpj bilateral .  . Chronic headaches   . Colon polyp    tubular adenoma  . Constipation   . DDD  (degenerative disc disease), lumbar   . Depression (emotion)   . Elevated hemoglobin A1c   . Fatigue   . Frequent headaches   . Hyperlipidemia   . Hyponatremia    2012  . IBS (irritable bowel syndrome)   . Insomnia   . Neuropathy    fingers/feet  . Osteoarthritis   . Osteopenia   . Raynaud's disease /phenomenon   . Rosacea   . Tinnitus aurium, bilateral    Allergies  Allergen Reactions  . Codeine Other (See Comments)    Crawling skin , weird twilight dreams  . Tramadol Itching        Past Surgical History:  Procedure Laterality Date  . BREAST REDUCTION SURGERY N/A    complications of blood supply  . BUNIONECTOMY Bilateral   . CARDIAC CATHETERIZATION     06/2005  . DENTAL SURGERY    . DIGIT NAIL REMOVAL Right    fingernail removed 4th  . FACIAL COSMETIC SURGERY    . VAGINAL HYSTERECTOMY N/A    Family History  Problem Relation Age of Onset  . Dementia Mother   . Cancer Father   . Hypertension Father   . Fibromyalgia Sister   . Heart attack Maternal Aunt   . Heart attack Maternal Uncle   . Cancer Paternal Aunt   . Breast cancer Paternal Aunt   . Hypertension Sister   . Breast cancer Cousin   . Heart attack Maternal Uncle   . Rheum arthritis Paternal Grandmother    Social History   Social History Narrative  . Not on file    Allergies as of 08/27/2019      Reactions   Codeine Other (See Comments)   Crawling skin , weird twilight dreams   Tramadol Itching         Medication List       Accurate as of August 27, 2019  2:54 PM. If you have any questions, ask your nurse or doctor.        amLODipine 2.5 MG tablet Commonly known as: NORVASC Take 1 tablet (2.5 mg total) by mouth daily. (For Raynauds )   D-3-5 125 MCG (5000 UT) capsule Generic drug: Cholecalciferol   Estradiol 10 MCG Tabs vaginal tablet Commonly known as: Yuvafem Place 1 tablet (10 mcg total) vaginally 2 (two) times a week.   fluticasone 50 MCG/ACT nasal spray Commonly known as:  Flonase Place 2 sprays into both nostrils daily.   gabapentin 300 MG capsule Commonly known as: NEURONTIN Take 1 capsule (300 mg total) by mouth 3 (three) times daily.   KRILL OIL PO Take 5,000 mg by mouth daily.   lidocaine 5 % ointment Commonly known as: XYLOCAINE Apply topically as directed no more than once a day   MAGNESIUM PO Take by mouth.   methocarbamol 750 MG tablet Commonly known  as: ROBAXIN Take 750 mg by mouth every 6 (six) hours as needed for muscle spasms.   NON FORMULARY Nerve Renew   rosuvastatin 10 MG tablet Commonly known as: CRESTOR Take 1 tablet (10 mg total) by mouth daily.   VITAMIN B 12 PO Take 1,000 mcg by mouth.   VITAMIN C PO Take by mouth.   VITAMIN E PO Take by mouth.   Zinc 50 MG Caps   zolpidem 5 MG tablet Commonly known as: AMBIEN Take 0.5-1 tablets (2.5-5 mg total) by mouth at bedtime as needed for sleep.       All past medical history, surgical history, allergies, family history, immunizations andmedications were updated in the EMR today and reviewed under the history and medication portions of their EMR.     No results found for this or any previous visit (from the past 2160 hour(s)).    ROS: 14 pt review of systems performed and negative (unless mentioned in an HPI)  Objective: There were no vitals taken for this visit. Gen: Afebrile. No acute distress.  HENT: AT. [place].  Eyes:Pupils Equal Round Reactive to light, Extraocular movements intact,  Conjunctiva without redness, discharge or icterus. Chest: no cough , no shortness of breath Neuro:  Alert. Oriented x3  Psych: Normal affect, dress and demeanor. Normal speech. Normal thought content and judgment.  No exam data present  Assessment/plan: BUFORD GAYLER is a 69 y.o. female present for CPE Hyperlipidemia, unspecified hyperlipidemia type Continue crestor 10, follow yearly at cpe Continue omega-3 supplementation.  Currently taking omega-3 1000 mg daily Raynaud's  disease without gangrene -stable. Continue amlodipine 2.5 mg daily - follow yearly at cpe Sleep disturbance.insomnia Stable. [place] controlled substance database reviewed 08/27/19 -Ambien/controlled contract signed. Continue ambien QHS (takes about 2/3 a pill qhs) Lumbar radicular pain/cervical spondylosis/degenerative cervical spine/degenerative lumbar spine -She is no longer on prescribed pain medications through her pain specialist.  She is using CBD oil to control her pain. -Continue  gabapentin 300/600    Follow-up 02/27/2020 for CPE/CMC   No orders of the defined types were placed in this encounter.  Meds ordered this encounter  Medications  . amLODipine (NORVASC) 2.5 MG tablet    Sig: Take 1 tablet (2.5 mg total) by mouth daily. (For Raynauds )    Dispense:  90 tablet    Refill:  1  . gabapentin (NEURONTIN) 300 MG capsule    Sig: Take 1 capsule (300 mg total) by mouth 3 (three) times daily.    Dispense:  270 capsule    Refill:  1    Do not fill until requested  . zolpidem (AMBIEN) 5 MG tablet    Sig: Take 0.5-1 tablets (2.5-5 mg total) by mouth at bedtime as needed for sleep.    Dispense:  30 tablet    Refill:  5   Referral Orders  No referral(s) requested today     Electronically signed by: Howard Pouch, [place]

## 2019-09-18 DIAGNOSIS — M5030 Other cervical disc degeneration, unspecified cervical region: Secondary | ICD-10-CM | POA: Diagnosis not present

## 2019-10-18 ENCOUNTER — Other Ambulatory Visit: Payer: Self-pay | Admitting: Physical Medicine and Rehabilitation

## 2019-10-18 DIAGNOSIS — M5030 Other cervical disc degeneration, unspecified cervical region: Secondary | ICD-10-CM

## 2019-10-25 ENCOUNTER — Other Ambulatory Visit: Payer: Self-pay | Admitting: Physical Medicine and Rehabilitation

## 2019-10-25 DIAGNOSIS — M5136 Other intervertebral disc degeneration, lumbar region: Secondary | ICD-10-CM

## 2019-10-31 ENCOUNTER — Other Ambulatory Visit: Payer: Self-pay

## 2019-10-31 ENCOUNTER — Other Ambulatory Visit: Payer: PPO

## 2019-10-31 ENCOUNTER — Ambulatory Visit
Admission: RE | Admit: 2019-10-31 | Discharge: 2019-10-31 | Disposition: A | Discharge status: Home / Self Care | Payer: PPO | Source: Ambulatory Visit | Attending: Physical Medicine and Rehabilitation | Admitting: Physical Medicine and Rehabilitation

## 2019-10-31 DIAGNOSIS — M47816 Spondylosis without myelopathy or radiculopathy, lumbar region: Secondary | ICD-10-CM | POA: Diagnosis not present

## 2019-10-31 DIAGNOSIS — M5136 Other intervertebral disc degeneration, lumbar region: Secondary | ICD-10-CM

## 2019-10-31 MED ORDER — IOPAMIDOL (ISOVUE-M 200) INJECTION 41%
1.0000 mL | Freq: Once | INTRAMUSCULAR | Status: AC
  Administered 2019-10-31: 1 mL via EPIDURAL

## 2019-10-31 MED ORDER — METHYLPREDNISOLONE ACETATE 40 MG/ML INJ SUSP (RADIOLOG
120.0000 mg | Freq: Once | INTRAMUSCULAR | Status: AC
  Administered 2019-10-31: 120 mg via EPIDURAL

## 2019-11-15 ENCOUNTER — Telehealth: Payer: Self-pay | Admitting: Family Medicine

## 2019-11-15 NOTE — Telephone Encounter (Signed)
Left message for patient to schedule Annual Wellness Visit.  Please schedule with Nurse Health Advisor Martha Stanley, RN at [facility]  °

## 2019-11-19 NOTE — Progress Notes (Signed)
Subjective:   Nichole [name] is a 69 y.o. female who presents for Medicare Annual (Subsequent) preventive examination.  I connected with Mykah today by telephone and verified that I am speaking with the correct person using two identifiers. Location patient: home Location provider: work Persons participating in the virtual visit: patient, Marine scientist.    I discussed the limitations, risks, security and privacy concerns of performing an evaluation and management service by telephone and the availability of in person appointments. I also discussed with the patient that there may be a patient responsible charge related to this service. The patient expressed understanding and verbally consented to this telephonic visit.    Interactive audio and video telecommunications were attempted between this provider and patient, however failed, due to patient having technical difficulties OR patient did not have access to video capability.  We continued and completed visit with audio only.  Some vital signs may be absent or patient reported.   Time Spent with patient on telephone encounter: 25 minutes  Review of Systems     Cardiac Risk Factors include: advanced age (>83men, >28 women);dyslipidemia     Objective:    Today's Vitals   11/20/19 1456  Weight: 120 lb (54.4 kg)  Height: 4\' 11"  (1.499 m)   Body mass index is 24.24 kg/m.  Advanced Directives 11/20/2019 01/10/2017  Does Patient Have a Medical Advance Directive? Yes Yes  Type of Paramedic of [place];Living will [place];Living will  Copy of [place] in Chart? No - copy requested No - copy requested    Current Medications (verified) Outpatient Encounter Medications as of 11/20/2019  Medication Sig  . amLODipine (NORVASC) 2.5 MG tablet Take 1 tablet (2.5 mg total) by mouth daily. (For Raynauds )  . Ascorbic Acid (VITAMIN C PO) Take by mouth.  . Cholecalciferol (D-3-5) 125 MCG  (5000 UT) capsule   . Cyanocobalamin (VITAMIN B 12 PO) Take 1,000 mcg by mouth.  . Estradiol (YUVAFEM) 10 MCG TABS vaginal tablet Place 1 tablet (10 mcg total) vaginally 2 (two) times a week.  . gabapentin (NEURONTIN) 300 MG capsule Take 1 capsule (300 mg total) by mouth 3 (three) times daily.  Marland Kitchen HYDROmorphone (DILAUDID) 2 MG tablet Take 2 mg by mouth 3 (three) times daily as needed.  Marland Kitchen KRILL OIL PO Take 5,000 mg by mouth daily.   Marland Kitchen lidocaine (XYLOCAINE) 5 % ointment Apply topically as directed no more than once a day  . MAGNESIUM PO Take by mouth.  . methocarbamol (ROBAXIN) 750 MG tablet Take 750 mg by mouth every 6 (six) hours as needed for muscle spasms.  . NON FORMULARY Nerve Renew  . rosuvastatin (CRESTOR) 10 MG tablet Take 1 tablet (10 mg total) by mouth daily.  Marland Kitchen VITAMIN E PO Take by mouth.  . Zinc 50 MG CAPS   . zolpidem (AMBIEN) 5 MG tablet Take 0.5-1 tablets (2.5-5 mg total) by mouth at bedtime as needed for sleep.  . [DISCONTINUED] fluticasone (FLONASE) 50 MCG/ACT nasal spray Place 2 sprays into both nostrils daily.   No facility-administered encounter medications on file as of 11/20/2019.    Allergies (verified) Codeine and Tramadol   History: Past Medical History:  Diagnosis Date  . Anxiety   . Asthma   . Basal cell carcinoma   . Capsulitis of foot 05/02/2012   acute capsulitis 2nd mpj bilateral .  . Chronic headaches   . Colon polyp    tubular adenoma  .  Constipation   . DDD (degenerative disc disease), lumbar   . Depression (emotion)   . Elevated hemoglobin A1c   . Fatigue   . Frequent headaches   . Hyperlipidemia   . Hyponatremia    2012  . IBS (irritable bowel syndrome)   . Insomnia   . Neuropathy    fingers/feet  . Osteoarthritis   . Osteopenia   . Raynaud's disease /phenomenon   . Rosacea   . Tinnitus aurium, bilateral    Past Surgical History:  Procedure Laterality Date  . BREAST REDUCTION SURGERY N/A    complications of blood supply  .  BUNIONECTOMY Bilateral   . CARDIAC CATHETERIZATION     06/2005  . DENTAL SURGERY    . DIGIT NAIL REMOVAL Right    fingernail removed 4th  . FACIAL COSMETIC SURGERY    . VAGINAL HYSTERECTOMY N/A    Family History  Problem Relation Age of Onset  . Dementia Mother   . Cancer Father   . Hypertension Father   . Fibromyalgia Sister   . Heart attack Maternal Aunt   . Heart attack Maternal Uncle   . Cancer Paternal Aunt   . Breast cancer Paternal Aunt   . Hypertension Sister   . Breast cancer Cousin   . Heart attack Maternal Uncle   . Rheum arthritis Paternal Grandmother    Social History   Socioeconomic History  . Marital status: Married    Spouse name: Not on file  . Number of children: Not on file  . Years of education: Not on file  . Highest education level: Not on file  Occupational History  . Occupation: retired  Tobacco Use  . Smoking status: Never Smoker  . Smokeless tobacco: Never Used  Vaping Use  . Vaping Use: Never used  Substance and Sexual Activity  . Alcohol use: No  . Drug use: No  . Sexual activity: Yes    Birth control/protection: Post-menopausal    Comment: maybe once a month due to vaginal dryness  Other Topics Concern  . Not on file  Social History Narrative  . Not on file   Social Determinants of Health   Financial Resource Strain: Low Risk   . Difficulty of Paying Living Expenses: Not hard at all  Food Insecurity: No Food Insecurity  . Worried About Charity fundraiser in the Last Year: Never true  . Ran Out of Food in the Last Year: Never true  Transportation Needs: No Transportation Needs  . Lack of Transportation (Medical): No  . Lack of Transportation (Non-Medical): No  Physical Activity: Sufficiently Active  . Days of Exercise per Week: 5 days  . Minutes of Exercise per Session: 60 min  Stress: No Stress Concern Present  . Feeling of Stress : Not at all  Social Connections: Socially Integrated  . Frequency of Communication with  Friends and Family: More than three times a week  . Frequency of Social Gatherings with Friends and Family: Once a week  . Attends Religious Services: More than 4 times per year  . Active Member of Clubs or Organizations: Yes  . Attends Archivist Meetings: More than 4 times per year  . Marital Status: Married    Tobacco Counseling Counseling given: Not Answered   Clinical Intake:  Pre-visit preparation completed: Yes  Pain : No/denies pain     Nutritional Status: BMI of 19-24  Normal Nutritional Risks: None Diabetes: No  How often do you need to have someone  help you when you read instructions, pamphlets, or other written materials from your doctor or pharmacy?: 1 - Never What is the last grade level you completed in school?: Associate's degree  Diabetic?No  Interpreter Needed?: No  Information entered by :: Lesly Dukes LPN   Activities of Daily Living In your present state of health, do you have any difficulty performing the following activities: 11/20/2019  Hearing? N  Vision? N  Difficulty concentrating or making decisions? N  Walking or climbing stairs? N  Dressing or bathing? N  Doing errands, shopping? N  Preparing Food and eating ? N  Using the Toilet? N  In the past six months, have you accidently leaked urine? N  Do you have problems with loss of bowel control? N  Managing your Medications? N  Managing your Finances? N  Housekeeping or managing your Housekeeping? N  Some recent data might be hidden    Patient Care Team: Ma Hillock, DO as PCP - General (Family Medicine) Suella Broad, MD as Consulting Physician (Physical Medicine and Rehabilitation) Megan Salon, MD as Consulting Physician (Gynecology) Richmond Campbell, MD as Consulting Physician (Gastroenterology) Paulla Dolly Tamala Fothergill, DPM as Consulting Physician (Podiatry) Rolm Bookbinder, MD as Consulting Physician (Dermatology) Ralene Bathe, MD as Consulting Physician  (Ophthalmology) Toribio Harbour, DC as Referring Physician (Chiropractic Medicine)  Indicate any recent Medical Services you may have received from other than [facility] providers in the past year (date may be approximate).     Assessment:   This is a routine wellness examination for Nichole [name].  Hearing/Vision screen  Hearing Screening   125Hz  250Hz  500Hz  1000Hz  2000Hz  3000Hz  4000Hz  6000Hz  8000Hz   Right ear:           Left ear:           Comments: No issues  Vision Screening Comments: Wears contacts Last eye exam-2020- [facility]  Dietary issues and exercise activities discussed: Current Exercise Habits: Home exercise routine, Type of exercise: walking (exercise class), Time (Minutes): 60, Frequency (Times/Week): 6, Weekly Exercise (Minutes/Week): 360, Intensity: Moderate, Exercise limited by: None identified  Goals    . Increase physical activity     Increase activity.       Depression Screen PHQ 2/9 Scores 11/20/2019 02/26/2019 08/14/2018 01/30/2018 01/30/2018 01/10/2017 12/31/2015  PHQ - 2 Score 1 0 2 2 0 1 4  PHQ- 9 Score - - 5 7 - 6 9    Fall Risk Fall Risk  11/20/2019 02/26/2019 01/30/2018 01/30/2018 01/10/2017  Falls in the past year? 0 0 0 0 Yes  Number falls in past yr: 0 - - 0 1  Injury with Fall? 0 - - 0 No  Risk for fall due to : - Medication side effect - - -  Follow up Falls prevention discussed Falls evaluation completed;Education provided;Falls prevention discussed - Falls evaluation completed Falls prevention discussed    Any stairs in or around the home? Yes  If so, are there any without handrails? No  Home free of loose throw rugs in walkways, pet beds, electrical cords, etc? Yes  Adequate lighting in your home to reduce risk of falls? Yes   ASSISTIVE DEVICES UTILIZED TO PREVENT FALLS:  Life alert? No  Use of a cane, walker or w/c? No  Grab bars in the bathroom? Yes  Shower chair or bench in shower? No  Elevated toilet seat or a  handicapped toilet? No   TIMED UP AND GO:  Was the test performed? No .  Phone visit   Cognitive Function:No cognitive impairment noted     6CIT Screen 11/20/2019  What Year? 0 points  What month? 0 points  What time? 0 points  Count back from 20 0 points  Months in reverse 0 points  Repeat phrase 2 points  Total Score 2    Immunizations Immunization History  Administered Date(s) Administered  . Pneumococcal Conjugate-13 12/31/2015  . Pneumococcal Polysaccharide-23 05/01/2014  . Tdap 11/17/2011, 05/01/2014  . Zoster 11/16/2009  . Zoster Recombinat (Shingrix) 07/31/2017, 02/02/2018    TDAP status: Up to date   Flu Vaccine status: Declined, Education has been provided regarding the importance of this vaccine but patient still declined. Advised may receive this vaccine at local pharmacy or Health Dept. Aware to provide a copy of the vaccination record if obtained from local pharmacy or Health Dept. Verbalized acceptance and understanding.   Pneumococcal vaccine status: Up to date   Covid-19 vaccine status: Declined, Education has been provided regarding the importance of this vaccine but patient still declined. Advised may receive this vaccine at local pharmacy or Health Dept.or vaccine clinic. Aware to provide a copy of the vaccination record if obtained from local pharmacy or Health Dept. Verbalized acceptance and understanding.   Qualifies for Shingles Vaccine? No   Zostavax completed Yes   Shingrix Completed?: Yes  Screening Tests Health Maintenance  Topic Date Due  . COVID-19 Vaccine (1) Never done  . COLONOSCOPY  01/03/2018  . MAMMOGRAM  08/07/2019  . INFLUENZA VACCINE  Never done  . TETANUS/TDAP  04/30/2024  . DEXA SCAN  Completed  . Hepatitis C Screening  Completed  . PNA vac Low Risk Adult  Completed    Health Maintenance  Health Maintenance Due  Topic Date Due  . COVID-19 Vaccine (1) Never done  . COLONOSCOPY  01/03/2018  . MAMMOGRAM  08/07/2019  .  INFLUENZA VACCINE  Never done    Colorectal Cancer Screening: Due-Patient states she will call herself to schedule.  Mammogram status: Ordered previously. Pt provided with contact info and advised to call to schedule appt.    Bone Density status: Due-Patient wishes to schedule on her on.  Lung Cancer Screening: (Low Dose CT Chest recommended if Age 65-80 years, 30 pack-year currently smoking OR have quit w/in 15years.) does not qualify.    Additional Screening:  Hepatitis C Screening: Completed 12/24/2014  Vision Screening: Recommended annual ophthalmology exams for early detection of glaucoma and other disorders of the eye. Is the patient up to date with their annual eye exam?  No  Who is the provider or what is the name of the office in which the patient attends annual eye exams?  Ophthalmology-Appt scheduled for 01/2020   Dental Screening: Recommended annual dental exams for proper oral hygiene  Community Resource Referral / Chronic Care Management: CRR required this visit?  No   CCM required this visit?  No      Plan:     I have personally reviewed and noted the following in the patient's chart:   . Medical and social history . Use of alcohol, tobacco or illicit drugs  . Current medications and supplements . Functional ability and status . Nutritional status . Physical activity . Advanced directives . List of other physicians . Hospitalizations, surgeries, and ER visits in previous 12 months . Vitals . Screenings to include cognitive, depression, and falls . Referrals and appointments  In addition, I have reviewed and discussed with patient certain preventive protocols, quality metrics, and best  practice recommendations. A written personalized care plan for preventive services as well as general preventive health recommendations were provided to patient.   Due to this being a telephonic visit, the after visit summary with patients personalized plan was  offered to patient via mail or my-chart. Patient would like to access on my-chart.   Marta Antu, LPN   [id number]  Nurse Health Advisor  Nurse Notes: None

## 2019-11-20 ENCOUNTER — Ambulatory Visit (INDEPENDENT_AMBULATORY_CARE_PROVIDER_SITE_OTHER): Payer: PPO

## 2019-11-20 VITALS — Ht 59.0 in | Wt 120.0 lb

## 2019-11-20 DIAGNOSIS — Z0000 Encounter for general adult medical examination without abnormal findings: Secondary | ICD-10-CM | POA: Diagnosis not present

## 2019-11-20 NOTE — Patient Instructions (Signed)
[name] , Thank you for taking time to complete your Medicare Wellness Visit. I appreciate your ongoing commitment to your health goals. Please review the following plan we discussed and let me know if I can assist you in the future.   Screening recommendations/referrals: Colonoscopy: Due- Per our conversation, you will call to schedule. Mammogram: Due-  Per our conversation, you will call to schedule Bone Density: Due- Per our conversation, you will call to schedule Please call the office if you need assistance scheduling the above screenings. Recommended yearly ophthalmology/optometry visit for glaucoma screening and checkup Recommended yearly dental visit for hygiene and checkup  Vaccinations: Influenza vaccine: Declined Pneumococcal vaccine: Completed vaccines Tdap vaccine: Up to Date- Due-04/30/2024 Shingles vaccine: Completed vaccines Covid-19: Declined  Advanced directives: Please bring a copy for your chart  Conditions/risks identified: See problem list  Next appointment: Follow up in one year for your annual wellness visit 11/25/2020 @3 :00   Preventive Care 65 Years and Older, Female Preventive care refers to lifestyle choices and visits with your health care provider that can promote health and wellness. What does preventive care include?  A yearly physical exam. This is also called an annual well check.  Dental exams once or twice a year.  Routine eye exams. Ask your health care provider how often you should have your eyes checked.  Personal lifestyle choices, including:  Daily care of your teeth and gums.  Regular physical activity.  Eating a healthy diet.  Avoiding tobacco and drug use.  Limiting alcohol use.  Practicing safe sex.  Taking low-dose aspirin every day.  Taking vitamin and mineral supplements as recommended by your health care provider. What happens during an annual well check? The services and screenings done by your health care provider  during your annual well check will depend on your age, overall health, lifestyle risk factors, and family history of disease. Counseling  Your health care provider may ask you questions about your:  Alcohol use.  Tobacco use.  Drug use.  Emotional well-being.  Home and relationship well-being.  Sexual activity.  Eating habits.  History of falls.  Memory and ability to understand (cognition).  Work and work Statistician.  Reproductive health. Screening  You may have the following tests or measurements:  Height, weight, and BMI.  Blood pressure.  Lipid and cholesterol levels. These may be checked every 5 years, or more frequently if you are over 21 years old.  Skin check.  Lung cancer screening. You may have this screening every year starting at age 68 if you have a 30-pack-year history of smoking and currently smoke or have quit within the past 15 years.  Fecal occult blood test (FOBT) of the stool. You may have this test every year starting at age 47.  Flexible sigmoidoscopy or colonoscopy. You may have a sigmoidoscopy every 5 years or a colonoscopy every 10 years starting at age 65.  Hepatitis C blood test.  Hepatitis B blood test.  Sexually transmitted disease (STD) testing.  Diabetes screening. This is done by checking your blood sugar (glucose) after you have not eaten for a while (fasting). You may have this done every 1-3 years.  Bone density scan. This is done to screen for osteoporosis. You may have this done starting at age 69.  Mammogram. This may be done every 1-2 years. Talk to your health care provider about how often you should have regular mammograms. Talk with your health care provider about your test results, treatment options, and if necessary,  the need for more tests. Vaccines  Your health care provider may recommend certain vaccines, such as:  Influenza vaccine. This is recommended every year.  Tetanus, diphtheria, and acellular pertussis  (Tdap, Td) vaccine. You may need a Td booster every 10 years.  Zoster vaccine. You may need this after age 29.  Pneumococcal 13-valent conjugate (PCV13) vaccine. One dose is recommended after age 52.  Pneumococcal polysaccharide (PPSV23) vaccine. One dose is recommended after age 28. Talk to your health care provider about which screenings and vaccines you need and how often you need them. This information is not intended to replace advice given to you by your health care provider. Make sure you discuss any questions you have with your health care provider. Document Released: 02/06/2015 Document Revised: 09/30/2015 Document Reviewed: 11/11/2014 Elsevier Interactive Patient Education  2017 [place] Prevention in the Home Falls can cause injuries. They can happen to people of all ages. There are many things you can do to make your home safe and to help prevent falls. What can I do on the outside of my home?  Regularly fix the edges of walkways and driveways and fix any cracks.  Remove anything that might make you trip as you walk through a door, such as a raised step or threshold.  Trim any bushes or trees on the path to your home.  Use bright outdoor lighting.  Clear any walking paths of anything that might make someone trip, such as rocks or tools.  Regularly check to see if handrails are loose or broken. Make sure that both sides of any steps have handrails.  Any raised decks and porches should have guardrails on the edges.  Have any leaves, snow, or ice cleared regularly.  Use sand or salt on walking paths during winter.  Clean up any spills in your garage right away. This includes oil or grease spills. What can I do in the bathroom?  Use night lights.  Install grab bars by the toilet and in the tub and shower. Do not use towel bars as grab bars.  Use non-skid mats or decals in the tub or shower.  If you need to sit down in the shower, use a plastic, non-slip  stool.  Keep the floor dry. Clean up any water that spills on the floor as soon as it happens.  Remove soap buildup in the tub or shower regularly.  Attach bath mats securely with double-sided non-slip rug tape.  Do not have throw rugs and other things on the floor that can make you trip. What can I do in the bedroom?  Use night lights.  Make sure that you have a light by your bed that is easy to reach.  Do not use any sheets or blankets that are too big for your bed. They should not hang down onto the floor.  Have a firm chair that has side arms. You can use this for support while you get dressed.  Do not have throw rugs and other things on the floor that can make you trip. What can I do in the kitchen?  Clean up any spills right away.  Avoid walking on wet floors.  Keep items that you use a lot in easy-to-reach places.  If you need to reach something above you, use a strong step stool that has a grab bar.  Keep electrical cords out of the way.  Do not use floor polish or wax that makes floors slippery. If you must use  wax, use non-skid floor wax.  Do not have throw rugs and other things on the floor that can make you trip. What can I do with my stairs?  Do not leave any items on the stairs.  Make sure that there are handrails on both sides of the stairs and use them. Fix handrails that are broken or loose. Make sure that handrails are as long as the stairways.  Check any carpeting to make sure that it is firmly attached to the stairs. Fix any carpet that is loose or worn.  Avoid having throw rugs at the top or bottom of the stairs. If you do have throw rugs, attach them to the floor with carpet tape.  Make sure that you have a light switch at the top of the stairs and the bottom of the stairs. If you do not have them, ask someone to add them for you. What else can I do to help prevent falls?  Wear shoes that:  Do not have high heels.  Have rubber bottoms.  Are  comfortable and fit you well.  Are closed at the toe. Do not wear sandals.  If you use a stepladder:  Make sure that it is fully opened. Do not climb a closed stepladder.  Make sure that both sides of the stepladder are locked into place.  Ask someone to hold it for you, if possible.  Clearly mark and make sure that you can see:  Any grab bars or handrails.  First and last steps.  Where the edge of each step is.  Use tools that help you move around (mobility aids) if they are needed. These include:  Canes.  Walkers.  Scooters.  Crutches.  Turn on the lights when you go into a dark area. Replace any light bulbs as soon as they burn out.  Set up your furniture so you have a clear path. Avoid moving your furniture around.  If any of your floors are uneven, fix them.  If there are any pets around you, be aware of where they are.  Review your medicines with your doctor. Some medicines can make you feel dizzy. This can increase your chance of falling. Ask your doctor what other things that you can do to help prevent falls. This information is not intended to replace advice given to you by your health care provider. Make sure you discuss any questions you have with your health care provider. Document Released: 11/06/2008 Document Revised: 06/18/2015 Document Reviewed: 02/14/2014 Elsevier Interactive Patient Education  2017 [employer].

## 2019-12-30 ENCOUNTER — Other Ambulatory Visit: Payer: Self-pay | Admitting: Physical Medicine and Rehabilitation

## 2019-12-30 DIAGNOSIS — M5136 Other intervertebral disc degeneration, lumbar region: Secondary | ICD-10-CM

## 2019-12-30 DIAGNOSIS — M5030 Other cervical disc degeneration, unspecified cervical region: Secondary | ICD-10-CM

## 2020-01-16 DIAGNOSIS — Z20828 Contact with and (suspected) exposure to other viral communicable diseases: Secondary | ICD-10-CM | POA: Diagnosis not present

## 2020-02-02 IMAGING — XA DG DISKOGRAPHY LUMBAR S+I
5 series · 5 of 5 positions shown · non-contrast
Comparison: MRI 02/12/2018

CLINICAL DATA: Degenerative disc disease L4-5. Lumbago. Clinical
request for anesthetic injection intra discal L4-5.

EXAM:
Therapeutic LUMBAR DISK INJECTION

[Series 1: ortho standard · 1 of 1 slices shown (1 of 5)]
[im 1/1]
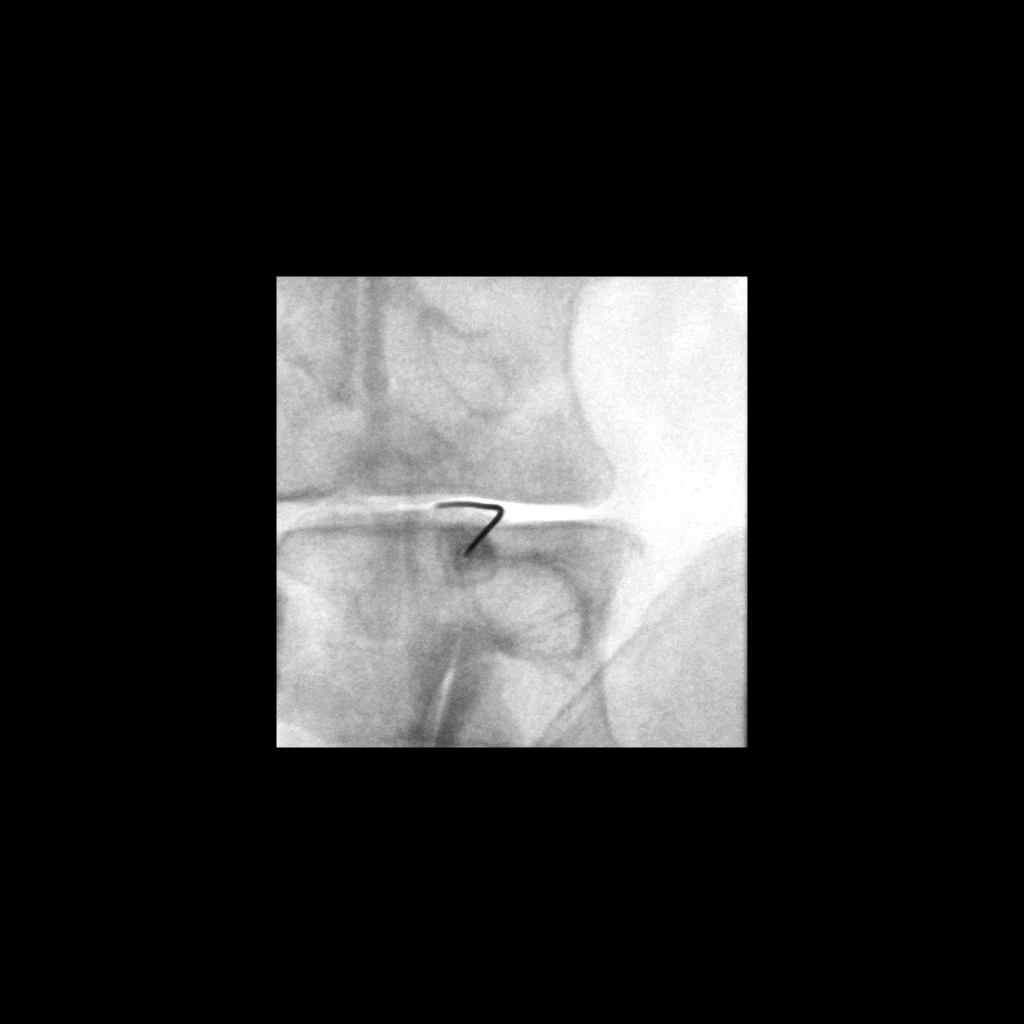

[Series 2: ortho standard · 1 of 1 slices shown (2 of 5)]
[im 1/1]
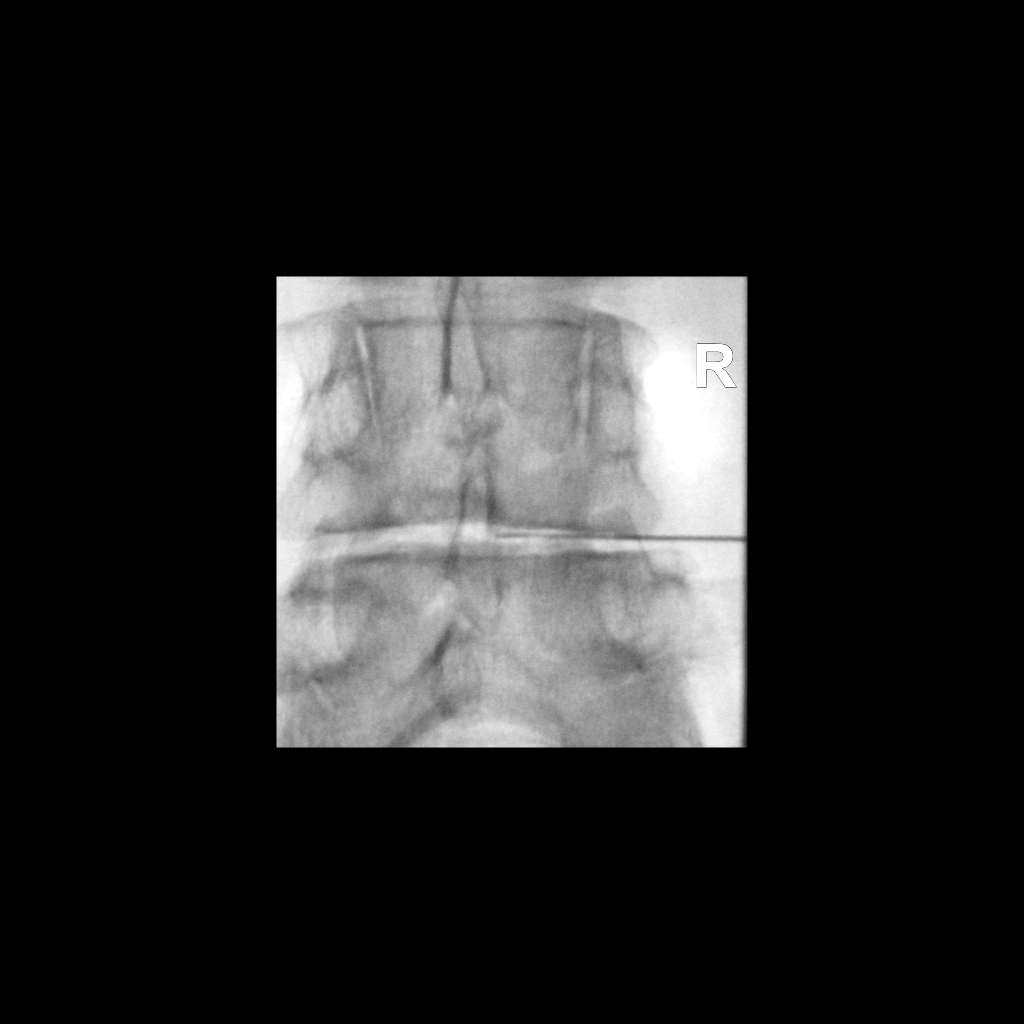

[Series 3: ortho standard · 1 of 1 slices shown (3 of 5)]
[im 1/1]
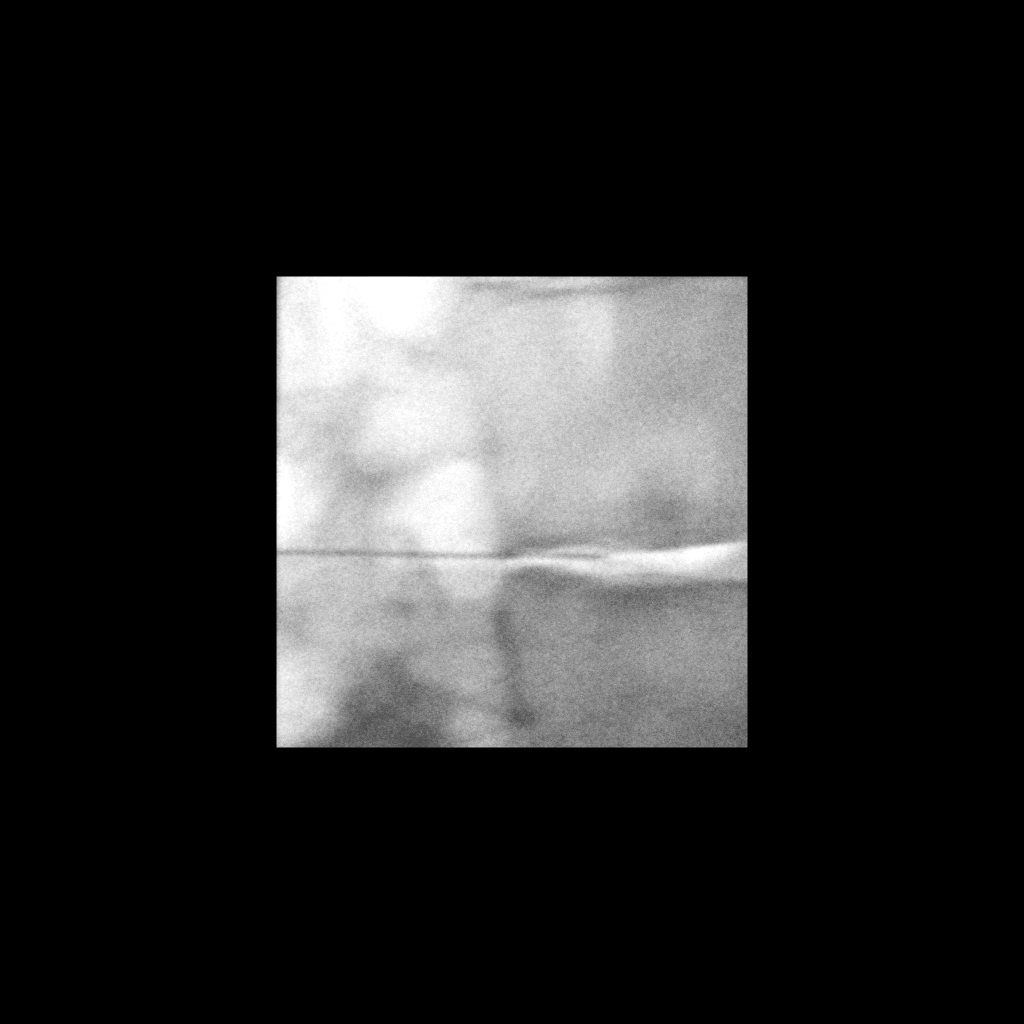

[Series 4: ortho standard · 1 of 1 slices shown (4 of 5)]
[im 1/1]
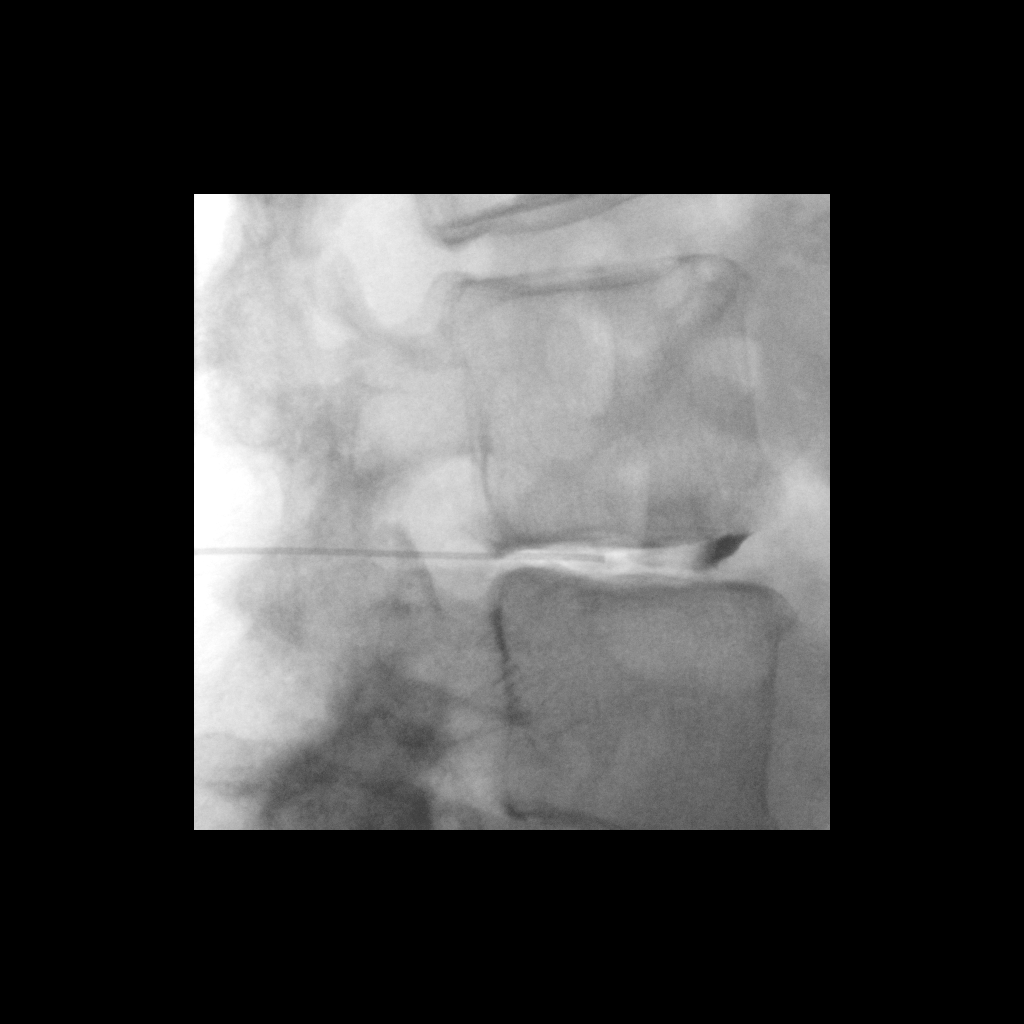

[Series 5: ortho standard · 1 of 1 slices shown (5 of 5)]
[im 1/1]
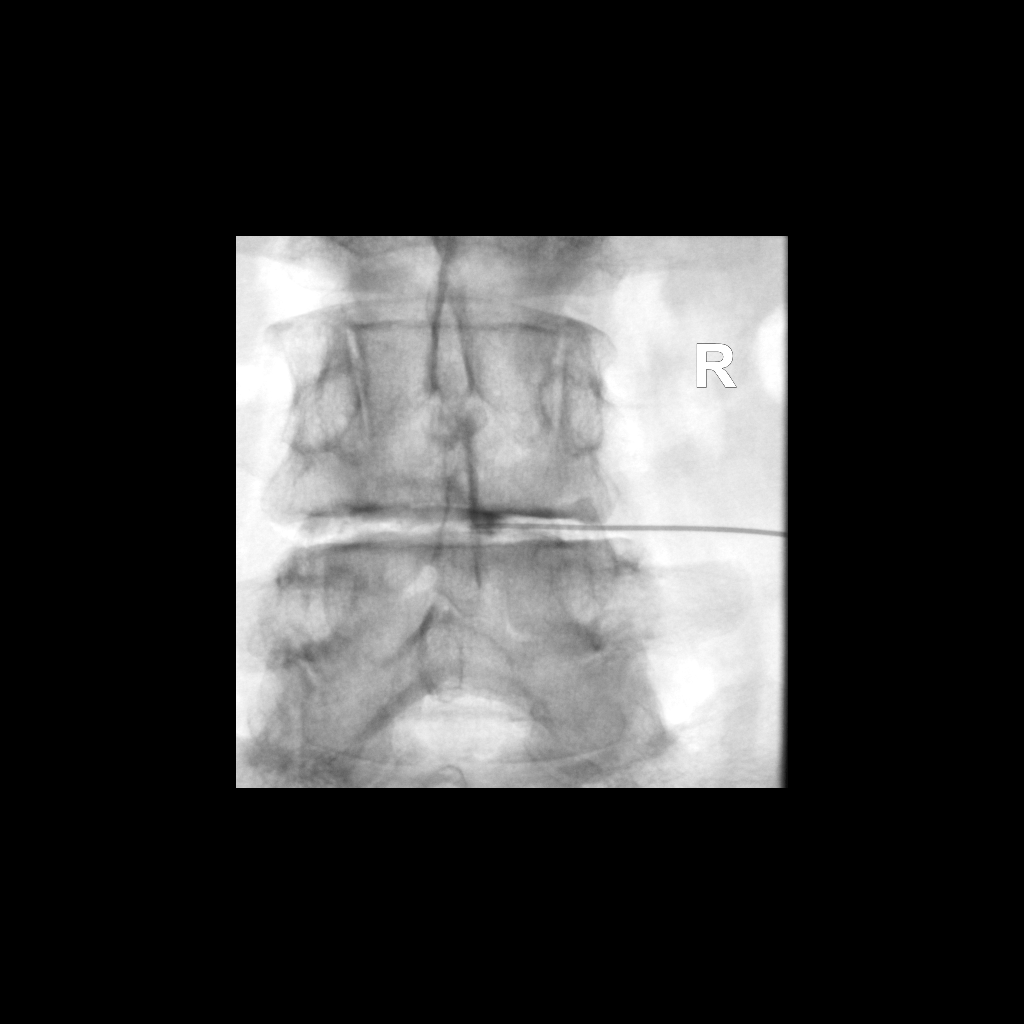

[5 of 5 positions shown; findings below may reference images not displayed]

PROCEDURE:
The procedure was discussed in depth with the patient including the
potential risk of infection. A Betadine scrub of the low back was
performed and the patient was draped in a sterile fashion. Skin
anesthesia was carried [DATE]%Lidocaine. Using a right sided
oblique approach, a 15 cm 22 gauge Chiba needle was directed into
the nuclear regions of the disc at L4-5. A few drops of Isovue 200
were instilled showing good position. This is a diffusely disrupted
disc. I instilled 2.5 cc of 0.5% bupivacaine. This was a very low
pressure injection because of the diffuse disruption of the disc.
She was observed for an hour after the injection and discharged in
good condition. She was feeling less back pain lying in the supine
position.

FLUOROSCOPY TIME:  0 minutes 36 seconds. 36.68 micro gray meter
squared
IMPRESSION: Technically successful anesthetic injection within the markedly
degenerated L4-5 disc. 2.5 cc 0.5% bupivacaine instilled. At the
time of discharge, the patient was feeling less pain.

## 2020-02-11 DIAGNOSIS — H2513 Age-related nuclear cataract, bilateral: Secondary | ICD-10-CM | POA: Diagnosis not present

## 2020-02-11 DIAGNOSIS — H524 Presbyopia: Secondary | ICD-10-CM | POA: Diagnosis not present

## 2020-02-20 DIAGNOSIS — Z79899 Other long term (current) drug therapy: Secondary | ICD-10-CM | POA: Diagnosis not present

## 2020-02-20 DIAGNOSIS — Z5181 Encounter for therapeutic drug level monitoring: Secondary | ICD-10-CM | POA: Diagnosis not present

## 2020-02-20 DIAGNOSIS — M47812 Spondylosis without myelopathy or radiculopathy, cervical region: Secondary | ICD-10-CM | POA: Diagnosis not present

## 2020-02-27 DIAGNOSIS — D225 Melanocytic nevi of trunk: Secondary | ICD-10-CM | POA: Diagnosis not present

## 2020-02-27 DIAGNOSIS — L814 Other melanin hyperpigmentation: Secondary | ICD-10-CM | POA: Diagnosis not present

## 2020-02-27 DIAGNOSIS — D2271 Melanocytic nevi of right lower limb, including hip: Secondary | ICD-10-CM | POA: Diagnosis not present

## 2020-02-27 DIAGNOSIS — L239 Allergic contact dermatitis, unspecified cause: Secondary | ICD-10-CM | POA: Diagnosis not present

## 2020-02-27 DIAGNOSIS — L918 Other hypertrophic disorders of the skin: Secondary | ICD-10-CM | POA: Diagnosis not present

## 2020-02-27 DIAGNOSIS — Z85828 Personal history of other malignant neoplasm of skin: Secondary | ICD-10-CM | POA: Diagnosis not present

## 2020-02-27 DIAGNOSIS — L821 Other seborrheic keratosis: Secondary | ICD-10-CM | POA: Diagnosis not present

## 2020-02-27 DIAGNOSIS — D2272 Melanocytic nevi of left lower limb, including hip: Secondary | ICD-10-CM | POA: Diagnosis not present

## 2020-03-04 ENCOUNTER — Ambulatory Visit: Payer: PPO

## 2020-03-06 ENCOUNTER — Encounter: Payer: Self-pay | Admitting: Family Medicine

## 2020-03-10 ENCOUNTER — Ambulatory Visit: Payer: PPO | Admitting: Family Medicine

## 2020-05-01 DIAGNOSIS — E7800 Pure hypercholesterolemia, unspecified: Secondary | ICD-10-CM | POA: Diagnosis not present

## 2020-05-01 DIAGNOSIS — Z780 Asymptomatic menopausal state: Secondary | ICD-10-CM | POA: Diagnosis not present

## 2020-05-01 DIAGNOSIS — F5101 Primary insomnia: Secondary | ICD-10-CM | POA: Diagnosis not present

## 2020-05-01 DIAGNOSIS — I7300 Raynaud's syndrome without gangrene: Secondary | ICD-10-CM | POA: Diagnosis not present

## 2020-06-08 DIAGNOSIS — Z780 Asymptomatic menopausal state: Secondary | ICD-10-CM | POA: Diagnosis not present

## 2020-06-08 DIAGNOSIS — Z1231 Encounter for screening mammogram for malignant neoplasm of breast: Secondary | ICD-10-CM | POA: Diagnosis not present

## 2020-06-11 DIAGNOSIS — Z0001 Encounter for general adult medical examination with abnormal findings: Secondary | ICD-10-CM | POA: Diagnosis not present

## 2020-06-11 DIAGNOSIS — R7309 Other abnormal glucose: Secondary | ICD-10-CM | POA: Diagnosis not present

## 2020-06-18 DIAGNOSIS — I7300 Raynaud's syndrome without gangrene: Secondary | ICD-10-CM | POA: Diagnosis not present

## 2020-06-18 DIAGNOSIS — R7309 Other abnormal glucose: Secondary | ICD-10-CM | POA: Diagnosis not present

## 2020-06-18 DIAGNOSIS — E7800 Pure hypercholesterolemia, unspecified: Secondary | ICD-10-CM | POA: Diagnosis not present

## 2020-06-18 DIAGNOSIS — F5101 Primary insomnia: Secondary | ICD-10-CM | POA: Diagnosis not present

## 2020-06-18 DIAGNOSIS — Z0000 Encounter for general adult medical examination without abnormal findings: Secondary | ICD-10-CM | POA: Diagnosis not present

## 2020-06-18 DIAGNOSIS — Z0001 Encounter for general adult medical examination with abnormal findings: Secondary | ICD-10-CM | POA: Diagnosis not present

## 2020-06-19 ENCOUNTER — Other Ambulatory Visit: Payer: Self-pay | Admitting: Internal Medicine

## 2020-06-19 DIAGNOSIS — E7800 Pure hypercholesterolemia, unspecified: Secondary | ICD-10-CM

## 2020-07-02 DIAGNOSIS — Z1212 Encounter for screening for malignant neoplasm of rectum: Secondary | ICD-10-CM | POA: Diagnosis not present

## 2020-07-02 DIAGNOSIS — Z1211 Encounter for screening for malignant neoplasm of colon: Secondary | ICD-10-CM | POA: Diagnosis not present

## 2020-07-21 ENCOUNTER — Ambulatory Visit
Admission: RE | Admit: 2020-07-21 | Discharge: 2020-07-21 | Disposition: A | Discharge status: Home / Self Care | Payer: No Typology Code available for payment source | Source: Ambulatory Visit | Attending: Internal Medicine | Admitting: Internal Medicine

## 2020-07-21 DIAGNOSIS — E7800 Pure hypercholesterolemia, unspecified: Secondary | ICD-10-CM

## 2020-07-29 ENCOUNTER — Encounter (HOSPITAL_BASED_OUTPATIENT_CLINIC_OR_DEPARTMENT_OTHER): Payer: Self-pay

## 2020-07-29 ENCOUNTER — Ambulatory Visit (HOSPITAL_BASED_OUTPATIENT_CLINIC_OR_DEPARTMENT_OTHER): Payer: PPO | Admitting: Obstetrics & Gynecology

## 2020-07-29 NOTE — Progress Notes (Deleted)
70 y.o. G2P0202 Married White or Caucasian female here for breast and pelvic exam.  I am also following her for ***.  Denies vaginal bleeding.  No LMP recorded. Patient has had a hysterectomy.          Sexually active: {yes no:314532}  H/O STD:  {YES NO:22349}  Health Maintenance: PCP:  ****.  Last wellness appt was ***.  Did blood work at that appt:   Vaccines are up to date:  {YES NO:22349} Colonoscopy:  01/03/2013 MMG:  08/07/2018 BMD:  02/10/2017 Last pap smear:  ***.  Vaginal Hysterectomy H/o abnormal pap smear:      reports that she has never smoked. She has never used smokeless tobacco. She reports that she does not drink alcohol and does not use drugs.  Past Medical History:  Diagnosis Date   Anxiety    Asthma    Basal cell carcinoma    Capsulitis of foot 05/02/2012   acute capsulitis 2nd mpj bilateral .   Chronic headaches    Colon polyp    tubular adenoma   Constipation    DDD (degenerative disc disease), lumbar    Depression (emotion)    Elevated hemoglobin A1c    Fatigue    Frequent headaches    Hyperlipidemia    Hyponatremia    2012   IBS (irritable bowel syndrome)    Insomnia    Neuropathy    fingers/feet   Osteoarthritis    Osteopenia    Raynaud's disease /phenomenon    Rosacea    Tinnitus aurium, bilateral     Past Surgical History:  Procedure Laterality Date   BREAST REDUCTION SURGERY N/A    complications of blood supply   BUNIONECTOMY Bilateral    CARDIAC CATHETERIZATION     06/2005   DENTAL SURGERY     DIGIT NAIL REMOVAL Right    fingernail removed 4th   FACIAL COSMETIC SURGERY     VAGINAL HYSTERECTOMY N/A     Current Outpatient Medications  Medication Sig Dispense Refill   amLODipine (NORVASC) 2.5 MG tablet Take 1 tablet (2.5 mg total) by mouth daily. (For Raynauds ) 90 tablet 1   Ascorbic Acid (VITAMIN C PO) Take by mouth.     Cholecalciferol (D-3-5) 125 MCG (5000 UT) capsule      Cyanocobalamin (VITAMIN B 12 PO) Take 1,000 mcg by  mouth.     Estradiol (YUVAFEM) 10 MCG TABS vaginal tablet Place 1 tablet (10 mcg total) vaginally 2 (two) times a week. 24 tablet 4   gabapentin (NEURONTIN) 300 MG capsule Take 1 capsule (300 mg total) by mouth 3 (three) times daily. 270 capsule 1   HYDROmorphone (DILAUDID) 2 MG tablet Take 2 mg by mouth 3 (three) times daily as needed.     KRILL OIL PO Take 5,000 mg by mouth daily.      lidocaine (XYLOCAINE) 5 % ointment Apply topically as directed no more than once a day 35 g 0   MAGNESIUM PO Take by mouth.     methocarbamol (ROBAXIN) 750 MG tablet Take 750 mg by mouth every 6 (six) hours as needed for muscle spasms.     NON FORMULARY Nerve Renew     rosuvastatin (CRESTOR) 10 MG tablet Take 1 tablet (10 mg total) by mouth daily. 90 tablet 3   VITAMIN E PO Take by mouth.     Zinc 50 MG CAPS      zolpidem (AMBIEN) 5 MG tablet Take 0.5-1 tablets (2.5-5 mg total) by  mouth at bedtime as needed for sleep. 30 tablet 5   No current facility-administered medications for this visit.    Family History  Problem Relation Age of Onset   Dementia Mother    Cancer Father    Hypertension Father    Fibromyalgia Sister    Heart attack Maternal Aunt    Heart attack Maternal Uncle    Cancer Paternal Aunt    Breast cancer Paternal Aunt    Hypertension Sister    Breast cancer Cousin    Heart attack Maternal Uncle    Rheum arthritis Paternal Grandmother     Review of Systems  Exam:   There were no vitals taken for this visit.     General appearance: alert, cooperative and appears stated age Breasts: {Exam; breast:13139} Abdomen: soft, non-tender; bowel sounds normal; no masses,  no organomegaly Lymph nodes: Cervical, supraclavicular, and axillary nodes normal.  No abnormal inguinal nodes palpated Neurologic: Grossly normal  Pelvic: External genitalia:  no lesions              Urethra:  normal appearing urethra with no masses, tenderness or lesions              Bartholins and Skenes: normal                  Vagina: normal appearing vagina with atrophic changes ***and no discharge, no lesions              Cervix: {exam; cervix:14595}              Pap taken: {yes no:314532} Bimanual Exam:  Uterus:  {exam; uterus:12215}              Adnexa: {exam; adnexa:12223}               Rectovaginal: Confirms               Anus:  normal sphincter tone, no lesions  Chaperone, ***, CMA, was present for exam.  Assessment/Plan: There are no diagnoses linked to this encounter.

## 2020-08-07 IMAGING — XA Imaging study
3 series · 6 of 6 positions shown · non-contrast
Comparison: none

CLINICAL DATA: New L2 radiculopathy. Displacement of the L2-3 disc.

[Series 1: ortho standard · 1 of 1 slices shown (1 of 3)]
[im 1/1]
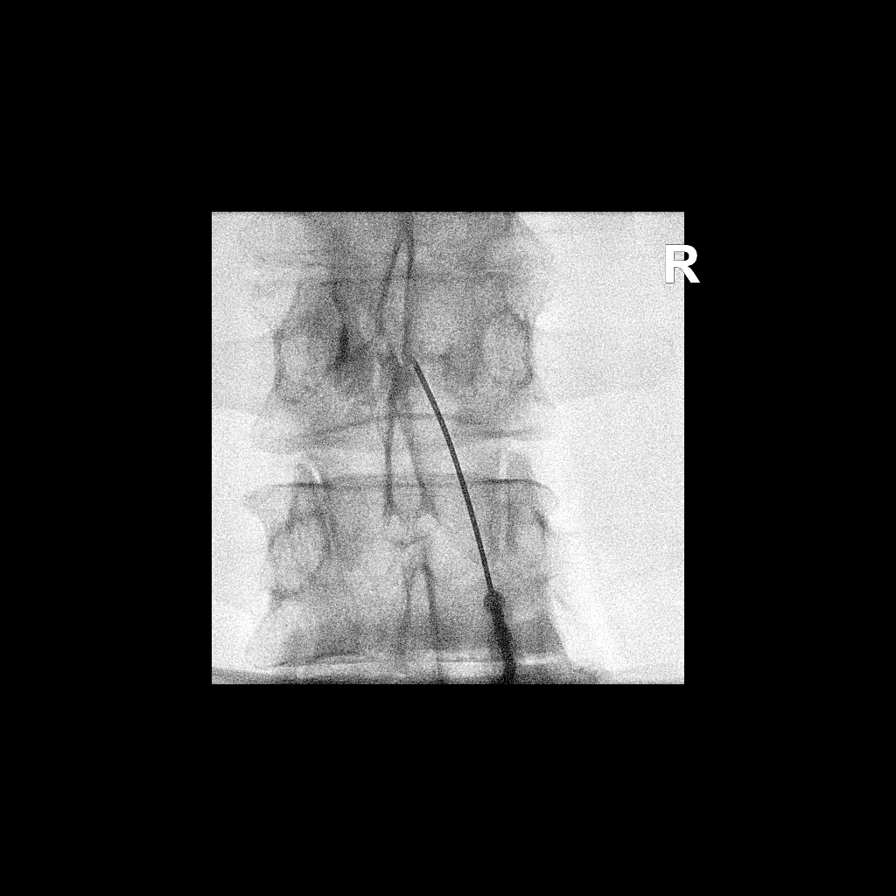

[Series 2: ortho standard · 4 of 21 frames shown (2 of 3)]
[frame 4/21]
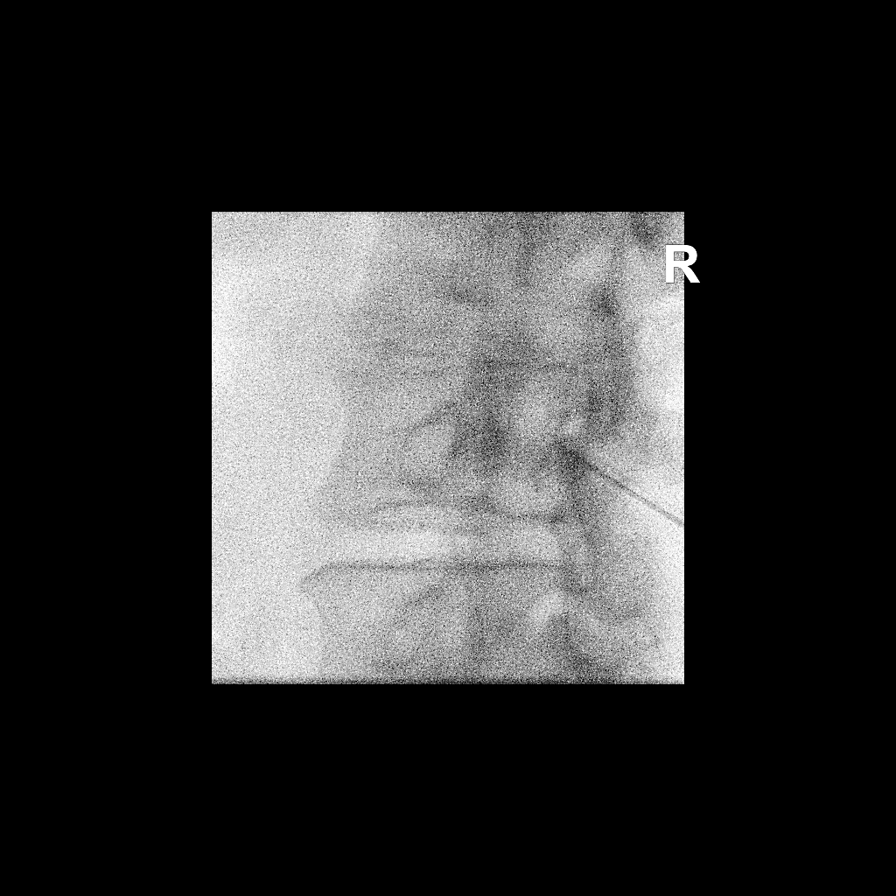
[frame 11/21]
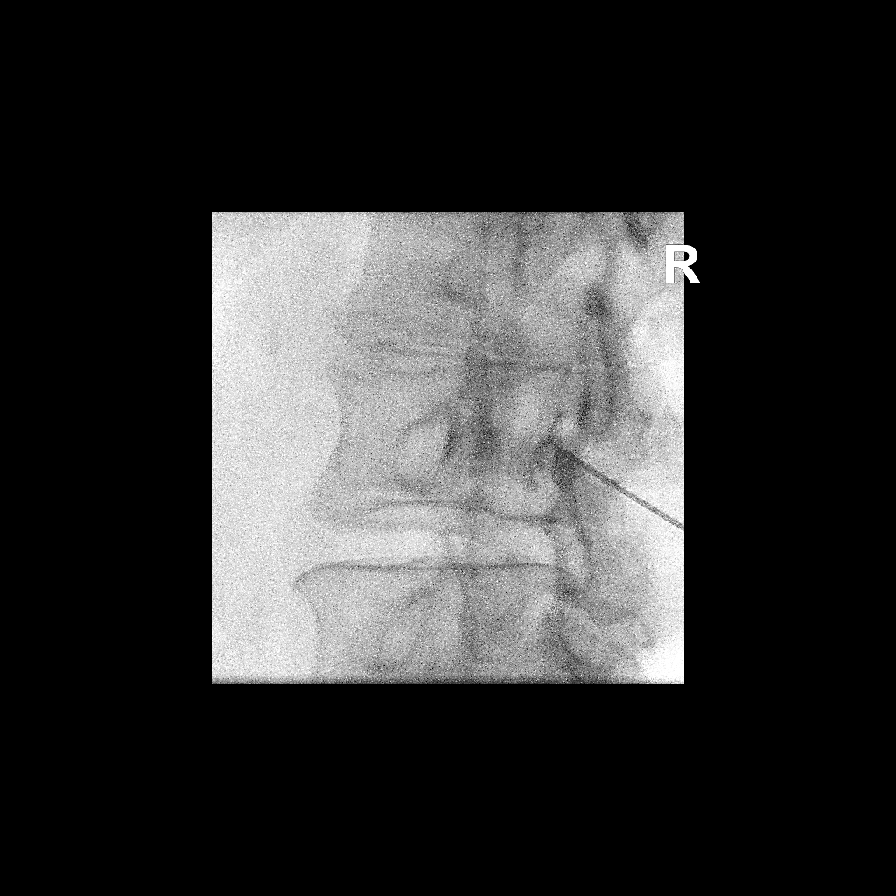
[frame 18/21]
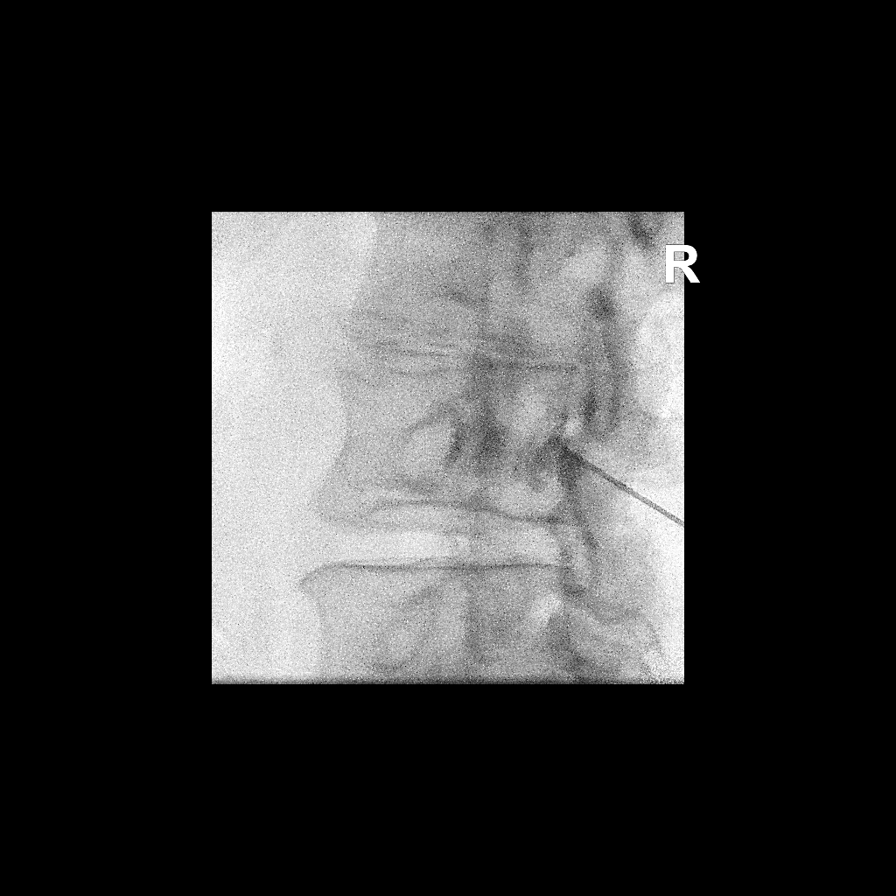
[frame 20/21]
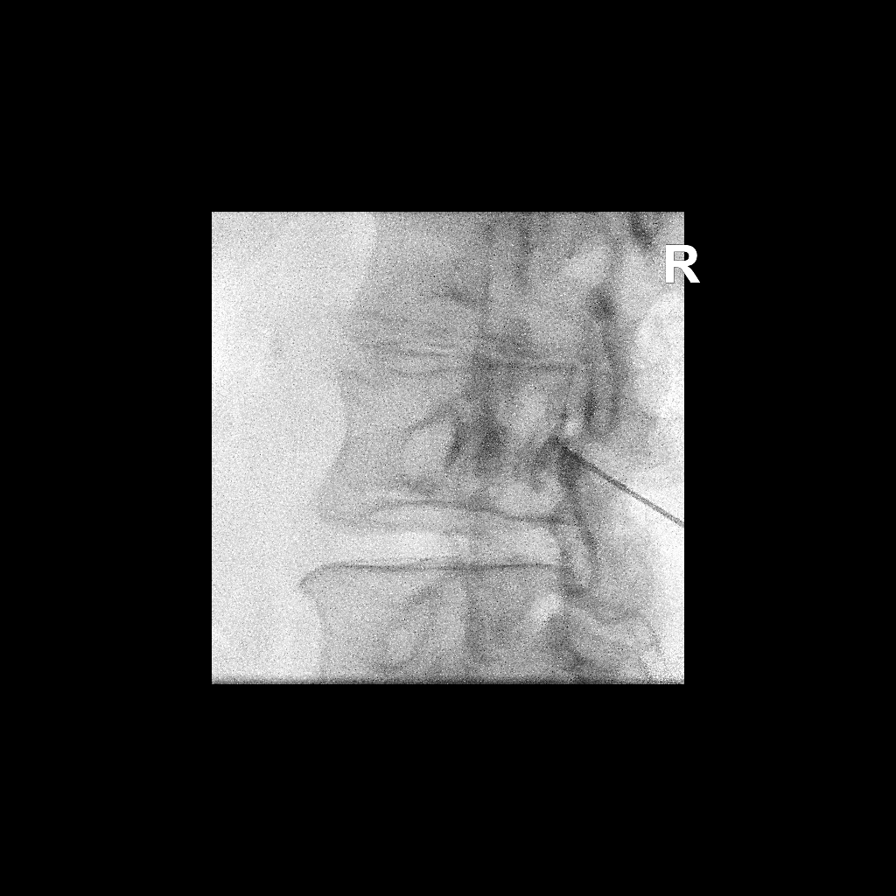

[Series 3: ortho standard · 1 of 1 slices shown (3 of 3)]
[im 1/1]
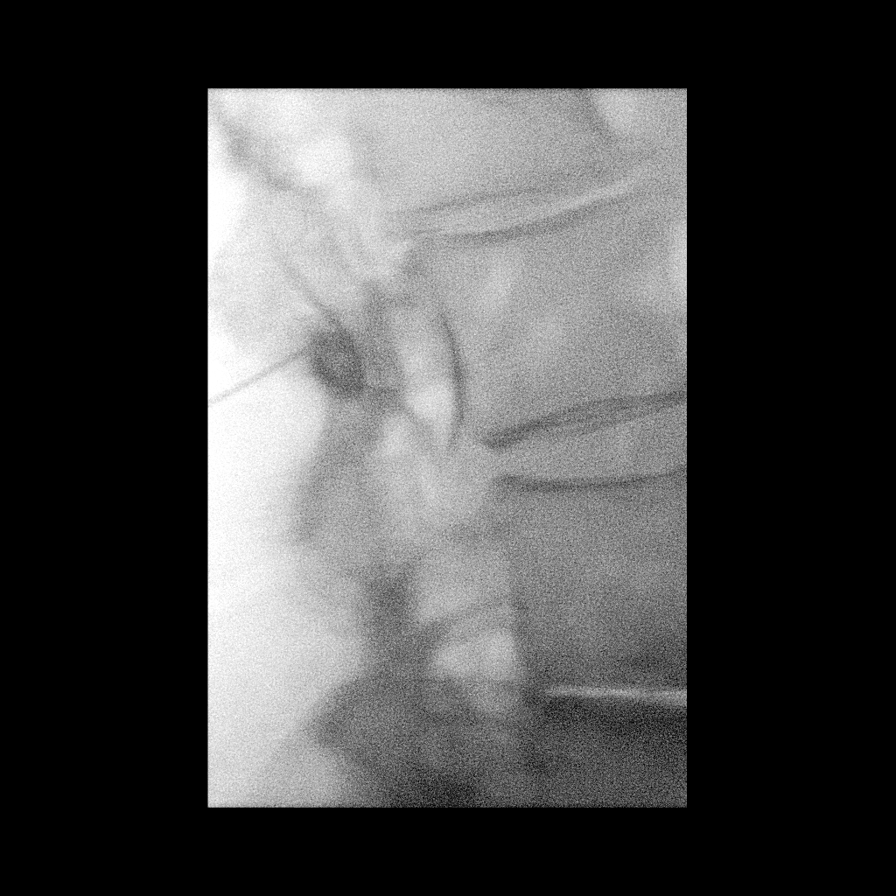

[6 of 6 positions shown; findings below may reference images not displayed]

FLUOROSCOPY TIME:  Radiation Exposure Index (as provided by the
fluoroscopic device): 15.18 uGy*m2

PROCEDURE:
The procedure, risks, benefits, and alternatives were explained to
the patient. Questions regarding the procedure were encouraged and
answered. The patient understands and consents to the procedure.

LUMBAR EPIDURAL INJECTION:

An interlaminar approach was performed on right at L2-3. The
overlying skin was cleansed and anesthetized. A 20 gauge epidural
needle was advanced using loss-of-resistance technique.

DIAGNOSTIC EPIDURAL INJECTION:

Injection of Isovue-M 200 shows a good epidural pattern with spread
above and below the level of needle placement, primarily on the
right no vascular opacification is seen.

THERAPEUTIC EPIDURAL INJECTION:

120 mg of Depo-Medrol mixed with 1.5 mL 1% lidocaine were instilled.
The procedure was well-tolerated, and the patient was discharged
thirty minutes following the injection in good condition.

COMPLICATIONS:
None
IMPRESSION: Technically successful epidural injection on the right L2-3 # 1

## 2020-08-13 DIAGNOSIS — M25511 Pain in right shoulder: Secondary | ICD-10-CM | POA: Diagnosis not present

## 2020-08-13 DIAGNOSIS — G894 Chronic pain syndrome: Secondary | ICD-10-CM | POA: Diagnosis not present

## 2020-08-14 ENCOUNTER — Ambulatory Visit (HOSPITAL_BASED_OUTPATIENT_CLINIC_OR_DEPARTMENT_OTHER): Payer: PPO | Admitting: Obstetrics & Gynecology

## 2020-08-14 ENCOUNTER — Ambulatory Visit: Payer: PPO

## 2020-08-27 DIAGNOSIS — Z0000 Encounter for general adult medical examination without abnormal findings: Secondary | ICD-10-CM | POA: Diagnosis not present

## 2020-08-27 DIAGNOSIS — E7800 Pure hypercholesterolemia, unspecified: Secondary | ICD-10-CM | POA: Diagnosis not present

## 2020-09-01 ENCOUNTER — Other Ambulatory Visit (HOSPITAL_BASED_OUTPATIENT_CLINIC_OR_DEPARTMENT_OTHER): Payer: Self-pay | Admitting: Obstetrics & Gynecology

## 2020-09-04 ENCOUNTER — Other Ambulatory Visit (HOSPITAL_BASED_OUTPATIENT_CLINIC_OR_DEPARTMENT_OTHER): Payer: Self-pay

## 2020-09-04 MED ORDER — ESTRADIOL 10 MCG VA TABS: 1.0000 | ORAL_TABLET | VAGINAL | 1 refills | Status: DC

## 2020-09-09 ENCOUNTER — Other Ambulatory Visit (HOSPITAL_BASED_OUTPATIENT_CLINIC_OR_DEPARTMENT_OTHER): Payer: Self-pay | Admitting: Obstetrics & Gynecology

## 2020-09-30 ENCOUNTER — Ambulatory Visit (INDEPENDENT_AMBULATORY_CARE_PROVIDER_SITE_OTHER): Payer: PPO | Admitting: Obstetrics & Gynecology

## 2020-09-30 ENCOUNTER — Encounter (HOSPITAL_BASED_OUTPATIENT_CLINIC_OR_DEPARTMENT_OTHER): Payer: Self-pay | Admitting: Obstetrics & Gynecology

## 2020-09-30 ENCOUNTER — Other Ambulatory Visit: Payer: Self-pay

## 2020-09-30 VITALS — BP 131/70 | HR 68 | Ht 59.0 in | Wt 126.6 lb

## 2020-09-30 DIAGNOSIS — Z90710 Acquired absence of both cervix and uterus: Secondary | ICD-10-CM | POA: Diagnosis not present

## 2020-09-30 DIAGNOSIS — Z9889 Other specified postprocedural states: Secondary | ICD-10-CM

## 2020-09-30 DIAGNOSIS — D126 Benign neoplasm of colon, unspecified: Secondary | ICD-10-CM | POA: Diagnosis not present

## 2020-09-30 DIAGNOSIS — Z01419 Encounter for gynecological examination (general) (routine) without abnormal findings: Secondary | ICD-10-CM | POA: Diagnosis not present

## 2020-09-30 DIAGNOSIS — N952 Postmenopausal atrophic vaginitis: Secondary | ICD-10-CM | POA: Diagnosis not present

## 2020-09-30 MED ORDER — ESTRADIOL 10 MCG VA TABS: 1.0000 | ORAL_TABLET | VAGINAL | 4 refills | Status: DC

## 2020-09-30 NOTE — Progress Notes (Signed)
70 y.o. G2P0202 Married White or Caucasian female here for breast and pelvic exam.  I am also following her for vaginal atrophic changes.  Family is good.  Had a "[place] side family cookout' over Labor Day.  Denies vaginal bleeding. Using vaginal estradiol tablet.  Reports "it doesn't do the whole job" but is much better than in the past.  No LMP recorded. Patient has had a hysterectomy.          Sexually active: Yes.    H/O STD:  no  Health Maintenance: PCP:  Thedora Hinders.  Last wellness appt was 04/2020.  Did blood work at that appt:  yes Vaccines are up to date:  does not get flu shots, declined covid vaccination Colonoscopy:  2014 follow up 5 years.  We discussed last year.  She did a cologuard this year.  We discussed guidelines as she has hx of adenomatous polyps.  I really feel she should have a colonoscopy for screening. MMG:  pt reports done 2022 BMD:  pt reports done 2022 Last pap smear:  2005, h/o hysterectomy.   H/o abnormal pap smear:  no   reports that she has never smoked. She has never used smokeless tobacco. She reports that she does not drink alcohol and does not use drugs.  Past Medical History:  Diagnosis Date   Anxiety    Asthma    Basal cell carcinoma    Capsulitis of foot 05/02/2012   acute capsulitis 2nd mpj bilateral .   Chronic headaches    Colon polyp    tubular adenoma   Constipation    DDD (degenerative disc disease), lumbar    Depression (emotion)    Elevated hemoglobin A1c    Fatigue    Frequent headaches    Hyperlipidemia    Hyponatremia    2012   IBS (irritable bowel syndrome)    Insomnia    Neuropathy    fingers/feet   Osteoarthritis    Osteopenia    Raynaud's disease /phenomenon    Rosacea    Tinnitus aurium, bilateral     Past Surgical History:  Procedure Laterality Date   BREAST REDUCTION SURGERY N/A    complications of blood supply   BUNIONECTOMY Bilateral    CARDIAC CATHETERIZATION     06/2005   DENTAL SURGERY     DIGIT NAIL  REMOVAL Right    fingernail removed 4th   FACIAL COSMETIC SURGERY     VAGINAL HYSTERECTOMY N/A     Current Outpatient Medications  Medication Sig Dispense Refill   amLODipine (NORVASC) 2.5 MG tablet Take 1 tablet (2.5 mg total) by mouth daily. (For Raynauds ) 90 tablet 1   Ascorbic Acid (VITAMIN C PO) Take by mouth.     Cholecalciferol (D-3-5) 125 MCG (5000 UT) capsule      Cyanocobalamin (VITAMIN B 12 PO) Take 1,000 mcg by mouth.     Estradiol (YUVAFEM) 10 MCG TABS vaginal tablet Place 1 tablet (10 mcg total) vaginally 2 (two) times a week. 24 tablet 1   gabapentin (NEURONTIN) 300 MG capsule Take 1 capsule (300 mg total) by mouth 3 (three) times daily. 270 capsule 1   HYDROmorphone (DILAUDID) 2 MG tablet Take 2 mg by mouth 3 (three) times daily as needed.     KRILL OIL PO Take 5,000 mg by mouth daily.      lidocaine (XYLOCAINE) 5 % ointment Apply topically as directed no more than once a day 35 g 0   MAGNESIUM PO Take  by mouth.     methocarbamol (ROBAXIN) 750 MG tablet Take 750 mg by mouth every 6 (six) hours as needed for muscle spasms.     NON FORMULARY Nerve Renew     rosuvastatin (CRESTOR) 10 MG tablet Take 1 tablet (10 mg total) by mouth daily. 90 tablet 3   VITAMIN E PO Take by mouth.     Zinc 50 MG CAPS      zolpidem (AMBIEN) 5 MG tablet Take 0.5-1 tablets (2.5-5 mg total) by mouth at bedtime as needed for sleep. 30 tablet 5   No current facility-administered medications for this visit.    Family History  Problem Relation Age of Onset   Dementia Mother    Cancer Father    Hypertension Father    Fibromyalgia Sister    Heart attack Maternal Aunt    Heart attack Maternal Uncle    Cancer Paternal Aunt    Breast cancer Paternal Aunt    Hypertension Sister    Breast cancer Cousin    Heart attack Maternal Uncle    Rheum arthritis Paternal Grandmother     Review of Systems  Constitutional: Negative.   Gastrointestinal: Negative.   Genitourinary: Negative.    Psychiatric/Behavioral: Negative.     Exam:   BP 131/70 (BP Location: Right Arm, Patient Position: Sitting, Cuff Size: Small)   Pulse 68   Ht '4\' 11"'$  (1.499 m) Comment: reported  Wt 126 lb 9.6 oz (57.4 kg)   BMI 25.57 kg/m   Height: '4\' 11"'$  (149.9 cm) (reported)  General appearance: alert, cooperative and appears stated age Breasts: normal appearance, no masses or tenderness, no nipples are present (this is stable) Abdomen: soft, non-tender; bowel sounds normal; no masses,  no organomegaly Lymph nodes: Cervical, supraclavicular, and axillary nodes normal.  No abnormal inguinal nodes palpated Neurologic: Grossly normal  Pelvic: External genitalia:  no lesions              Urethra:  normal appearing urethra with no masses, tenderness or lesions              Bartholins and Skenes: normal                 Vagina: normal appearing vagina with atrophic changes and no discharge, no lesions              Cervix: absent              Pap taken: No. Bimanual Exam:  Uterus:  uterus absent              Adnexa: no mass, fullness, tenderness               Rectovaginal: Confirms               Anus:  normal sphincter tone, no lesions  Chaperone, Octaviano Batty, CMA, was present for exam.  Assessment/Plan: 1. Encntr for gyn exam (general) (routine) w/o abn findings - pap not indicated - have called to get MMG and BMD results - lab work done with PCP 04/2020 - vaccines reviewed  2. Colon adenoma - we discussed screening guidelines and that cologuard would not be adequate screening given h/o adenomatous polyp.  D/w colonoscopy would be recommended.  Pt voices clear understanding.  3. History of hysterectomy - no HRT  4. Vaginal atrophy - Estradiol (YUVAFEM) 10 MCG TABS vaginal tablet; Place 1 tablet (10 mcg total) vaginally 2 (two) times a week.  Dispense: 24 tablet; Refill: 4  5.  History of bilateral breast reduction surgery - h/o complications and does not have nipples

## 2020-10-07 ENCOUNTER — Encounter (HOSPITAL_BASED_OUTPATIENT_CLINIC_OR_DEPARTMENT_OTHER): Payer: Self-pay | Admitting: Obstetrics & Gynecology

## 2020-11-25 ENCOUNTER — Ambulatory Visit: Payer: PPO

## 2020-12-01 ENCOUNTER — Other Ambulatory Visit: Payer: Self-pay | Admitting: Chiropractic Medicine

## 2020-12-01 DIAGNOSIS — R5381 Other malaise: Secondary | ICD-10-CM

## 2020-12-03 DIAGNOSIS — G894 Chronic pain syndrome: Secondary | ICD-10-CM | POA: Diagnosis not present

## 2020-12-07 ENCOUNTER — Other Ambulatory Visit: Payer: Self-pay | Admitting: Chiropractic Medicine

## 2020-12-07 DIAGNOSIS — M5412 Radiculopathy, cervical region: Secondary | ICD-10-CM

## 2020-12-07 DIAGNOSIS — M5416 Radiculopathy, lumbar region: Secondary | ICD-10-CM

## 2020-12-15 ENCOUNTER — Ambulatory Visit
Admission: RE | Admit: 2020-12-15 | Discharge: 2020-12-15 | Disposition: A | Discharge status: Home / Self Care | Payer: PPO | Source: Ambulatory Visit | Attending: Chiropractic Medicine | Admitting: Chiropractic Medicine

## 2020-12-15 ENCOUNTER — Other Ambulatory Visit: Payer: Self-pay

## 2020-12-15 DIAGNOSIS — M5416 Radiculopathy, lumbar region: Secondary | ICD-10-CM

## 2020-12-15 DIAGNOSIS — M5116 Intervertebral disc disorders with radiculopathy, lumbar region: Secondary | ICD-10-CM | POA: Diagnosis not present

## 2020-12-15 DIAGNOSIS — E7800 Pure hypercholesterolemia, unspecified: Secondary | ICD-10-CM | POA: Diagnosis not present

## 2020-12-15 DIAGNOSIS — Z0000 Encounter for general adult medical examination without abnormal findings: Secondary | ICD-10-CM | POA: Diagnosis not present

## 2020-12-15 MED ORDER — METHYLPREDNISOLONE ACETATE 40 MG/ML INJ SUSP (RADIOLOG
80.0000 mg | Freq: Once | INTRAMUSCULAR | Status: AC
  Administered 2020-12-15: 80 mg via EPIDURAL

## 2020-12-15 MED ORDER — IOPAMIDOL (ISOVUE-M 200) INJECTION 41%
1.0000 mL | Freq: Once | INTRAMUSCULAR | Status: AC
  Administered 2020-12-15: 1 mL via EPIDURAL

## 2020-12-15 NOTE — Discharge Instructions (Signed)

## 2020-12-21 DIAGNOSIS — E7800 Pure hypercholesterolemia, unspecified: Secondary | ICD-10-CM | POA: Diagnosis not present

## 2020-12-21 DIAGNOSIS — I7300 Raynaud's syndrome without gangrene: Secondary | ICD-10-CM | POA: Diagnosis not present

## 2020-12-21 DIAGNOSIS — F5101 Primary insomnia: Secondary | ICD-10-CM | POA: Diagnosis not present

## 2021-02-16 DIAGNOSIS — H5213 Myopia, bilateral: Secondary | ICD-10-CM | POA: Diagnosis not present

## 2021-02-16 DIAGNOSIS — H2513 Age-related nuclear cataract, bilateral: Secondary | ICD-10-CM | POA: Diagnosis not present

## 2021-02-16 DIAGNOSIS — H04123 Dry eye syndrome of bilateral lacrimal glands: Secondary | ICD-10-CM | POA: Diagnosis not present

## 2021-04-12 DIAGNOSIS — M79642 Pain in left hand: Secondary | ICD-10-CM | POA: Diagnosis not present

## 2021-04-21 DIAGNOSIS — L720 Epidermal cyst: Secondary | ICD-10-CM | POA: Diagnosis not present

## 2021-04-21 DIAGNOSIS — Z85828 Personal history of other malignant neoplasm of skin: Secondary | ICD-10-CM | POA: Diagnosis not present

## 2021-04-21 DIAGNOSIS — D2272 Melanocytic nevi of left lower limb, including hip: Secondary | ICD-10-CM | POA: Diagnosis not present

## 2021-04-21 DIAGNOSIS — L814 Other melanin hyperpigmentation: Secondary | ICD-10-CM | POA: Diagnosis not present

## 2021-04-21 DIAGNOSIS — L821 Other seborrheic keratosis: Secondary | ICD-10-CM | POA: Diagnosis not present

## 2021-04-21 DIAGNOSIS — D224 Melanocytic nevi of scalp and neck: Secondary | ICD-10-CM | POA: Diagnosis not present

## 2021-04-21 DIAGNOSIS — D692 Other nonthrombocytopenic purpura: Secondary | ICD-10-CM | POA: Diagnosis not present

## 2021-04-21 DIAGNOSIS — L918 Other hypertrophic disorders of the skin: Secondary | ICD-10-CM | POA: Diagnosis not present

## 2021-04-21 DIAGNOSIS — L570 Actinic keratosis: Secondary | ICD-10-CM | POA: Diagnosis not present

## 2021-04-21 DIAGNOSIS — L820 Inflamed seborrheic keratosis: Secondary | ICD-10-CM | POA: Diagnosis not present

## 2021-04-29 ENCOUNTER — Other Ambulatory Visit (HOSPITAL_BASED_OUTPATIENT_CLINIC_OR_DEPARTMENT_OTHER): Payer: Self-pay | Admitting: Obstetrics & Gynecology

## 2021-04-29 DIAGNOSIS — N952 Postmenopausal atrophic vaginitis: Secondary | ICD-10-CM

## 2021-04-29 NOTE — Telephone Encounter (Signed)
Pt needs appointment for additional refills ?

## 2021-05-23 DIAGNOSIS — M5136 Other intervertebral disc degeneration, lumbar region: Secondary | ICD-10-CM | POA: Diagnosis not present

## 2021-05-23 DIAGNOSIS — G894 Chronic pain syndrome: Secondary | ICD-10-CM | POA: Diagnosis not present

## 2021-05-23 DIAGNOSIS — M5030 Other cervical disc degeneration, unspecified cervical region: Secondary | ICD-10-CM | POA: Diagnosis not present

## 2021-05-23 DIAGNOSIS — M5416 Radiculopathy, lumbar region: Secondary | ICD-10-CM | POA: Diagnosis not present

## 2021-05-23 DIAGNOSIS — M5412 Radiculopathy, cervical region: Secondary | ICD-10-CM | POA: Diagnosis not present

## 2021-05-23 DIAGNOSIS — Z79891 Long term (current) use of opiate analgesic: Secondary | ICD-10-CM | POA: Diagnosis not present

## 2021-06-14 DIAGNOSIS — Z1231 Encounter for screening mammogram for malignant neoplasm of breast: Secondary | ICD-10-CM | POA: Diagnosis not present

## 2021-06-15 DIAGNOSIS — M5416 Radiculopathy, lumbar region: Secondary | ICD-10-CM | POA: Diagnosis not present

## 2021-06-16 DIAGNOSIS — E7801 Familial hypercholesterolemia: Secondary | ICD-10-CM | POA: Diagnosis not present

## 2021-06-16 DIAGNOSIS — F5101 Primary insomnia: Secondary | ICD-10-CM | POA: Diagnosis not present

## 2021-06-16 DIAGNOSIS — Z780 Asymptomatic menopausal state: Secondary | ICD-10-CM | POA: Diagnosis not present

## 2021-06-16 DIAGNOSIS — R7309 Other abnormal glucose: Secondary | ICD-10-CM | POA: Diagnosis not present

## 2021-06-16 DIAGNOSIS — Z0001 Encounter for general adult medical examination with abnormal findings: Secondary | ICD-10-CM | POA: Diagnosis not present

## 2021-06-22 DIAGNOSIS — Z0000 Encounter for general adult medical examination without abnormal findings: Secondary | ICD-10-CM | POA: Diagnosis not present

## 2021-06-22 DIAGNOSIS — I10 Essential (primary) hypertension: Secondary | ICD-10-CM | POA: Diagnosis not present

## 2021-06-22 DIAGNOSIS — E7800 Pure hypercholesterolemia, unspecified: Secondary | ICD-10-CM | POA: Diagnosis not present

## 2021-06-22 DIAGNOSIS — F5101 Primary insomnia: Secondary | ICD-10-CM | POA: Diagnosis not present

## 2021-06-29 DIAGNOSIS — M5412 Radiculopathy, cervical region: Secondary | ICD-10-CM | POA: Diagnosis not present

## 2021-09-22 DIAGNOSIS — Z5181 Encounter for therapeutic drug level monitoring: Secondary | ICD-10-CM | POA: Diagnosis not present

## 2021-09-22 DIAGNOSIS — M5412 Radiculopathy, cervical region: Secondary | ICD-10-CM | POA: Diagnosis not present

## 2021-09-22 DIAGNOSIS — M5416 Radiculopathy, lumbar region: Secondary | ICD-10-CM | POA: Diagnosis not present

## 2021-09-22 DIAGNOSIS — Z79899 Other long term (current) drug therapy: Secondary | ICD-10-CM | POA: Diagnosis not present

## 2021-10-07 ENCOUNTER — Encounter (HOSPITAL_BASED_OUTPATIENT_CLINIC_OR_DEPARTMENT_OTHER): Payer: Self-pay | Admitting: Obstetrics & Gynecology

## 2021-10-07 ENCOUNTER — Ambulatory Visit (INDEPENDENT_AMBULATORY_CARE_PROVIDER_SITE_OTHER): Payer: PPO | Admitting: Obstetrics & Gynecology

## 2021-10-07 VITALS — BP 135/75 | HR 71 | Ht 59.0 in | Wt 117.4 lb

## 2021-10-07 DIAGNOSIS — Z90710 Acquired absence of both cervix and uterus: Secondary | ICD-10-CM

## 2021-10-07 DIAGNOSIS — D126 Benign neoplasm of colon, unspecified: Secondary | ICD-10-CM | POA: Diagnosis not present

## 2021-10-07 DIAGNOSIS — N952 Postmenopausal atrophic vaginitis: Secondary | ICD-10-CM

## 2021-10-07 DIAGNOSIS — Z1211 Encounter for screening for malignant neoplasm of colon: Secondary | ICD-10-CM | POA: Diagnosis not present

## 2021-10-07 DIAGNOSIS — Z9889 Other specified postprocedural states: Secondary | ICD-10-CM | POA: Diagnosis not present

## 2021-10-07 DIAGNOSIS — Z01419 Encounter for gynecological examination (general) (routine) without abnormal findings: Secondary | ICD-10-CM

## 2021-10-07 MED ORDER — ESTRADIOL 10 MCG VA TABS: ORAL_TABLET | VAGINAL | 3 refills | Status: AC

## 2021-10-07 MED ORDER — ESTRADIOL 10 MCG VA TABS: ORAL_TABLET | VAGINAL | 3 refills | Status: DC

## 2021-10-07 NOTE — Progress Notes (Unsigned)
71 y.o. G2P0202 Married White or Caucasian female here for breast and pelvic exam.  I am also following her for atrophic changes.  Denies vaginal bleeding.   No LMP recorded. Patient has had a hysterectomy.          Sexually active: Yes.    H/O STD:  no  Health Maintenance: PCP:  Thedora Hinders.  Last wellness appt was 05/2021.  Did blood work at that appt:  yes Vaccines are up to date:  RSV Colonoscopy:  01/03/2013, overdue.  Pt did cologuard in 2022.  We discussed then proceeding with colonoscopy.  Ready to do this now.  Referral will be placed.   MMG:  06/08/2020 Negative.  Pt states she had one done this year.  Will call for records BMD:  06/08/2020, T score -1.0 Last pap smear:  2005, h/o hysterectomy.   H/o abnormal pap smear:  no     reports that she has never smoked. She has never used smokeless tobacco. She reports that she does not drink alcohol and does not use drugs.  Past Medical History:  Diagnosis Date   Anxiety    Asthma    Basal cell carcinoma    Capsulitis of foot 05/02/2012   acute capsulitis 2nd mpj bilateral .   Chronic headaches    Colon polyp    tubular adenoma   Constipation    DDD (degenerative disc disease), lumbar    Depression (emotion)    Elevated hemoglobin A1c    Fatigue    Frequent headaches    Hyperlipidemia    Hyponatremia    2012   IBS (irritable bowel syndrome)    Insomnia    Neuropathy    fingers/feet   Osteoarthritis    Osteopenia    Raynaud's disease /phenomenon    Rosacea    Tinnitus aurium, bilateral     Past Surgical History:  Procedure Laterality Date   BREAST REDUCTION SURGERY N/A    complications of blood supply   BUNIONECTOMY Bilateral    CARDIAC CATHETERIZATION     06/2005   DENTAL SURGERY     DIGIT NAIL REMOVAL Right    fingernail removed 4th   FACIAL COSMETIC SURGERY     VAGINAL HYSTERECTOMY N/A     Current Outpatient Medications  Medication Sig Dispense Refill   amLODipine (NORVASC) 2.5 MG tablet Take 1  tablet (2.5 mg total) by mouth daily. (For Raynauds ) 90 tablet 1   Ascorbic Acid (VITAMIN C PO) Take by mouth.     Cholecalciferol (D-3-5) 125 MCG (5000 UT) capsule      Cyanocobalamin (VITAMIN B 12 PO) Take 1,000 mcg by mouth.     Estradiol 10 MCG TABS vaginal tablet PLACE ONE TABLET VAGINALLY TWO TIMES WEEKLY 24 tablet 1   gabapentin (NEURONTIN) 300 MG capsule Take 1 capsule (300 mg total) by mouth 3 (three) times daily. 270 capsule 1   HYDROmorphone (DILAUDID) 2 MG tablet Take 2 mg by mouth 3 (three) times daily as needed.     KRILL OIL PO Take 5,000 mg by mouth daily.      lidocaine (XYLOCAINE) 5 % ointment Apply topically as directed no more than once a day 35 g 0   MAGNESIUM PO Take by mouth.     methocarbamol (ROBAXIN) 750 MG tablet Take 750 mg by mouth every 6 (six) hours as needed for muscle spasms.     NON FORMULARY Nerve Renew     rosuvastatin (CRESTOR) 10 MG tablet Take 1  tablet (10 mg total) by mouth daily. 90 tablet 3   VITAMIN E PO Take by mouth.     Zinc 50 MG CAPS      zolpidem (AMBIEN) 5 MG tablet Take 0.5-1 tablets (2.5-5 mg total) by mouth at bedtime as needed for sleep. 30 tablet 5   No current facility-administered medications for this visit.    Family History  Problem Relation Age of Onset   Dementia Mother    Cancer Father    Hypertension Father    Fibromyalgia Sister    Heart attack Maternal Aunt    Heart attack Maternal Uncle    Cancer Paternal Aunt    Breast cancer Paternal Aunt    Hypertension Sister    Breast cancer Cousin    Heart attack Maternal Uncle    Rheum arthritis Paternal Grandmother     Review of Systems  Constitutional: Negative.   Genitourinary: Negative.     Exam:   BP 135/75 (BP Location: Right Arm, Patient Position: Sitting, Cuff Size: Normal)   Pulse 71   Ht '4\' 11"'$  (1.499 m) Comment: Reported  Wt 117 lb 6.4 oz (53.3 kg)   BMI 23.71 kg/m   Height: '4\' 11"'$  (149.9 cm) (Reported)  General appearance: alert, cooperative and  appears stated age Breasts: normal appearance, no masses or tenderness, nipples are surgically absent Abdomen: soft, non-tender; bowel sounds normal; no masses,  no organomegaly Lymph nodes: Cervical, supraclavicular, and axillary nodes normal.  No abnormal inguinal nodes palpated Neurologic: Grossly normal  Pelvic: External genitalia:  no lesions              Urethra:  normal appearing urethra with no masses, tenderness or lesions              Bartholins and Skenes: normal                 Vagina: atrophic vaginal mucosa without lesions and no discharge, no lesions              Cervix: absent              Pap taken: No. Bimanual Exam:  Uterus:  uterus absent              Adnexa: no mass, fullness, tenderness               Rectovaginal: Confirms               Anus:  normal sphincter tone, no lesions  Chaperone, Octaviano Batty, CMA, was present for exam.  Assessment/Plan: There are no diagnoses linked to this encounter.

## 2021-10-07 NOTE — Patient Instructions (Addendum)
[facility] [place], [place]: [phone] Fax: [phone]   Possibly consider the RSV (respiratory syncytial virus) vaccination.  One time, lite-time.

## 2021-10-26 DIAGNOSIS — M5416 Radiculopathy, lumbar region: Secondary | ICD-10-CM | POA: Diagnosis not present

## 2021-10-26 DIAGNOSIS — H0011 Chalazion right upper eyelid: Secondary | ICD-10-CM | POA: Diagnosis not present

## 2022-01-19 DIAGNOSIS — M5416 Radiculopathy, lumbar region: Secondary | ICD-10-CM | POA: Diagnosis not present

## 2022-01-19 DIAGNOSIS — M5136 Other intervertebral disc degeneration, lumbar region: Secondary | ICD-10-CM | POA: Diagnosis not present

## 2022-01-19 DIAGNOSIS — Z79891 Long term (current) use of opiate analgesic: Secondary | ICD-10-CM | POA: Diagnosis not present

## 2022-01-19 DIAGNOSIS — G894 Chronic pain syndrome: Secondary | ICD-10-CM | POA: Diagnosis not present

## 2022-01-19 DIAGNOSIS — M5030 Other cervical disc degeneration, unspecified cervical region: Secondary | ICD-10-CM | POA: Diagnosis not present

## 2022-03-11 DIAGNOSIS — H2513 Age-related nuclear cataract, bilateral: Secondary | ICD-10-CM | POA: Diagnosis not present

## 2022-03-11 DIAGNOSIS — H524 Presbyopia: Secondary | ICD-10-CM | POA: Diagnosis not present

## 2022-05-10 DIAGNOSIS — R413 Other amnesia: Secondary | ICD-10-CM | POA: Diagnosis not present

## 2022-05-10 DIAGNOSIS — F439 Reaction to severe stress, unspecified: Secondary | ICD-10-CM | POA: Diagnosis not present

## 2022-05-10 DIAGNOSIS — J069 Acute upper respiratory infection, unspecified: Secondary | ICD-10-CM | POA: Diagnosis not present

## 2022-05-25 ENCOUNTER — Encounter: Payer: Self-pay | Admitting: Physician Assistant

## 2022-05-26 DIAGNOSIS — M5416 Radiculopathy, lumbar region: Secondary | ICD-10-CM | POA: Diagnosis not present

## 2022-05-26 DIAGNOSIS — M4722 Other spondylosis with radiculopathy, cervical region: Secondary | ICD-10-CM | POA: Diagnosis not present

## 2022-05-26 DIAGNOSIS — M5030 Other cervical disc degeneration, unspecified cervical region: Secondary | ICD-10-CM | POA: Diagnosis not present

## 2022-05-26 DIAGNOSIS — G894 Chronic pain syndrome: Secondary | ICD-10-CM | POA: Diagnosis not present

## 2022-06-02 DIAGNOSIS — M5416 Radiculopathy, lumbar region: Secondary | ICD-10-CM | POA: Diagnosis not present

## 2022-06-02 DIAGNOSIS — R413 Other amnesia: Secondary | ICD-10-CM | POA: Diagnosis not present

## 2022-06-02 DIAGNOSIS — F439 Reaction to severe stress, unspecified: Secondary | ICD-10-CM | POA: Diagnosis not present

## 2022-06-17 DIAGNOSIS — Z90710 Acquired absence of both cervix and uterus: Secondary | ICD-10-CM | POA: Diagnosis not present

## 2022-06-17 DIAGNOSIS — Z1231 Encounter for screening mammogram for malignant neoplasm of breast: Secondary | ICD-10-CM | POA: Diagnosis not present

## 2022-06-17 DIAGNOSIS — R29890 Loss of height: Secondary | ICD-10-CM | POA: Diagnosis not present

## 2022-06-17 DIAGNOSIS — Z8262 Family history of osteoporosis: Secondary | ICD-10-CM | POA: Diagnosis not present

## 2022-06-23 DIAGNOSIS — E7800 Pure hypercholesterolemia, unspecified: Secondary | ICD-10-CM | POA: Diagnosis not present

## 2022-06-23 DIAGNOSIS — R5383 Other fatigue: Secondary | ICD-10-CM | POA: Diagnosis not present

## 2022-06-23 DIAGNOSIS — R7309 Other abnormal glucose: Secondary | ICD-10-CM | POA: Diagnosis not present

## 2022-06-27 DIAGNOSIS — D2271 Melanocytic nevi of right lower limb, including hip: Secondary | ICD-10-CM | POA: Diagnosis not present

## 2022-06-27 DIAGNOSIS — Z85828 Personal history of other malignant neoplasm of skin: Secondary | ICD-10-CM | POA: Diagnosis not present

## 2022-06-27 DIAGNOSIS — L821 Other seborrheic keratosis: Secondary | ICD-10-CM | POA: Diagnosis not present

## 2022-06-27 DIAGNOSIS — L570 Actinic keratosis: Secondary | ICD-10-CM | POA: Diagnosis not present

## 2022-06-27 DIAGNOSIS — D225 Melanocytic nevi of trunk: Secondary | ICD-10-CM | POA: Diagnosis not present

## 2022-06-27 DIAGNOSIS — D2272 Melanocytic nevi of left lower limb, including hip: Secondary | ICD-10-CM | POA: Diagnosis not present

## 2022-06-27 DIAGNOSIS — D485 Neoplasm of uncertain behavior of skin: Secondary | ICD-10-CM | POA: Diagnosis not present

## 2022-06-28 DIAGNOSIS — F439 Reaction to severe stress, unspecified: Secondary | ICD-10-CM | POA: Diagnosis not present

## 2022-06-28 DIAGNOSIS — E038 Other specified hypothyroidism: Secondary | ICD-10-CM | POA: Diagnosis not present

## 2022-06-28 DIAGNOSIS — R7303 Prediabetes: Secondary | ICD-10-CM | POA: Diagnosis not present

## 2022-06-28 DIAGNOSIS — E7800 Pure hypercholesterolemia, unspecified: Secondary | ICD-10-CM | POA: Diagnosis not present

## 2022-06-28 DIAGNOSIS — Z0000 Encounter for general adult medical examination without abnormal findings: Secondary | ICD-10-CM | POA: Diagnosis not present

## 2022-06-30 ENCOUNTER — Ambulatory Visit: Payer: No Typology Code available for payment source

## 2022-06-30 ENCOUNTER — Ambulatory Visit: Payer: No Typology Code available for payment source | Admitting: Physician Assistant

## 2022-07-26 DIAGNOSIS — D485 Neoplasm of uncertain behavior of skin: Secondary | ICD-10-CM | POA: Diagnosis not present

## 2022-07-26 DIAGNOSIS — Z85828 Personal history of other malignant neoplasm of skin: Secondary | ICD-10-CM | POA: Diagnosis not present

## 2022-07-26 DIAGNOSIS — L988 Other specified disorders of the skin and subcutaneous tissue: Secondary | ICD-10-CM | POA: Diagnosis not present

## 2022-11-08 DIAGNOSIS — J45909 Unspecified asthma, uncomplicated: Secondary | ICD-10-CM | POA: Diagnosis not present

## 2022-11-08 DIAGNOSIS — R059 Cough, unspecified: Secondary | ICD-10-CM | POA: Diagnosis not present

## 2022-11-08 DIAGNOSIS — R413 Other amnesia: Secondary | ICD-10-CM | POA: Diagnosis not present

## 2022-12-14 DIAGNOSIS — M5412 Radiculopathy, cervical region: Secondary | ICD-10-CM | POA: Diagnosis not present

## 2022-12-14 DIAGNOSIS — M5416 Radiculopathy, lumbar region: Secondary | ICD-10-CM | POA: Diagnosis not present

## 2022-12-14 DIAGNOSIS — G894 Chronic pain syndrome: Secondary | ICD-10-CM | POA: Diagnosis not present

## 2022-12-14 DIAGNOSIS — M47812 Spondylosis without myelopathy or radiculopathy, cervical region: Secondary | ICD-10-CM | POA: Diagnosis not present

## 2022-12-14 DIAGNOSIS — M5030 Other cervical disc degeneration, unspecified cervical region: Secondary | ICD-10-CM | POA: Diagnosis not present

## 2022-12-26 DIAGNOSIS — M5416 Radiculopathy, lumbar region: Secondary | ICD-10-CM | POA: Diagnosis not present

## 2022-12-28 DIAGNOSIS — R7303 Prediabetes: Secondary | ICD-10-CM | POA: Diagnosis not present

## 2022-12-28 DIAGNOSIS — E038 Other specified hypothyroidism: Secondary | ICD-10-CM | POA: Diagnosis not present

## 2022-12-28 DIAGNOSIS — E7800 Pure hypercholesterolemia, unspecified: Secondary | ICD-10-CM | POA: Diagnosis not present

## 2023-01-04 DIAGNOSIS — F5101 Primary insomnia: Secondary | ICD-10-CM | POA: Diagnosis not present

## 2023-01-04 DIAGNOSIS — E7800 Pure hypercholesterolemia, unspecified: Secondary | ICD-10-CM | POA: Diagnosis not present

## 2023-01-04 DIAGNOSIS — I10 Essential (primary) hypertension: Secondary | ICD-10-CM | POA: Diagnosis not present

## 2023-01-04 DIAGNOSIS — M8589 Other specified disorders of bone density and structure, multiple sites: Secondary | ICD-10-CM | POA: Diagnosis not present

## 2023-02-08 DIAGNOSIS — J069 Acute upper respiratory infection, unspecified: Secondary | ICD-10-CM | POA: Diagnosis not present

## 2023-02-08 DIAGNOSIS — J029 Acute pharyngitis, unspecified: Secondary | ICD-10-CM | POA: Diagnosis not present

## 2023-03-13 DIAGNOSIS — H5213 Myopia, bilateral: Secondary | ICD-10-CM | POA: Diagnosis not present

## 2023-03-13 DIAGNOSIS — H2513 Age-related nuclear cataract, bilateral: Secondary | ICD-10-CM | POA: Diagnosis not present

## 2023-03-13 DIAGNOSIS — H524 Presbyopia: Secondary | ICD-10-CM | POA: Diagnosis not present

## 2023-03-13 DIAGNOSIS — H1789 Other corneal scars and opacities: Secondary | ICD-10-CM | POA: Diagnosis not present

## 2023-04-10 DIAGNOSIS — F5101 Primary insomnia: Secondary | ICD-10-CM | POA: Diagnosis not present

## 2023-04-10 DIAGNOSIS — R5383 Other fatigue: Secondary | ICD-10-CM | POA: Diagnosis not present

## 2023-04-10 DIAGNOSIS — F4321 Adjustment disorder with depressed mood: Secondary | ICD-10-CM | POA: Diagnosis not present

## 2023-04-10 DIAGNOSIS — E038 Other specified hypothyroidism: Secondary | ICD-10-CM | POA: Diagnosis not present

## 2023-04-10 DIAGNOSIS — F411 Generalized anxiety disorder: Secondary | ICD-10-CM | POA: Diagnosis not present

## 2023-04-10 DIAGNOSIS — R7303 Prediabetes: Secondary | ICD-10-CM | POA: Diagnosis not present

## 2023-04-24 DIAGNOSIS — R7989 Other specified abnormal findings of blood chemistry: Secondary | ICD-10-CM | POA: Diagnosis not present

## 2023-04-24 DIAGNOSIS — R413 Other amnesia: Secondary | ICD-10-CM | POA: Diagnosis not present

## 2023-04-24 DIAGNOSIS — F5101 Primary insomnia: Secondary | ICD-10-CM | POA: Diagnosis not present

## 2023-04-24 DIAGNOSIS — L299 Pruritus, unspecified: Secondary | ICD-10-CM | POA: Diagnosis not present

## 2023-05-01 DIAGNOSIS — R7303 Prediabetes: Secondary | ICD-10-CM | POA: Diagnosis not present

## 2023-05-01 DIAGNOSIS — I10 Essential (primary) hypertension: Secondary | ICD-10-CM | POA: Diagnosis not present

## 2023-05-01 DIAGNOSIS — E0590 Thyrotoxicosis, unspecified without thyrotoxic crisis or storm: Secondary | ICD-10-CM | POA: Diagnosis not present

## 2023-05-02 ENCOUNTER — Other Ambulatory Visit (HOSPITAL_COMMUNITY): Payer: Self-pay | Admitting: Endocrinology

## 2023-05-02 DIAGNOSIS — E038 Other specified hypothyroidism: Secondary | ICD-10-CM

## 2023-05-15 ENCOUNTER — Encounter (HOSPITAL_COMMUNITY)
Admission: RE | Admit: 2023-05-15 | Discharge: 2023-05-15 | Disposition: A | Discharge status: Home / Self Care | Source: Ambulatory Visit | Attending: Endocrinology | Admitting: Endocrinology

## 2023-05-15 DIAGNOSIS — E038 Other specified hypothyroidism: Secondary | ICD-10-CM | POA: Diagnosis not present

## 2023-05-15 MED ORDER — SODIUM IODIDE I-123 7.4 MBQ CAPS
457.0000 | ORAL_CAPSULE | Freq: Once | ORAL | Status: AC
  Administered 2023-05-15: 457 via ORAL

## 2023-05-16 ENCOUNTER — Encounter (HOSPITAL_COMMUNITY)
Admission: RE | Admit: 2023-05-16 | Discharge: 2023-05-16 | Disposition: A | Discharge status: Home / Self Care | Source: Ambulatory Visit | Attending: Endocrinology | Admitting: Endocrinology

## 2023-05-17 DIAGNOSIS — R251 Tremor, unspecified: Secondary | ICD-10-CM | POA: Diagnosis not present

## 2023-05-17 DIAGNOSIS — J029 Acute pharyngitis, unspecified: Secondary | ICD-10-CM | POA: Diagnosis not present

## 2023-05-17 DIAGNOSIS — E0590 Thyrotoxicosis, unspecified without thyrotoxic crisis or storm: Secondary | ICD-10-CM | POA: Diagnosis not present

## 2023-05-18 ENCOUNTER — Other Ambulatory Visit (HOSPITAL_COMMUNITY): Payer: Self-pay | Admitting: Endocrinology

## 2023-05-18 DIAGNOSIS — E0590 Thyrotoxicosis, unspecified without thyrotoxic crisis or storm: Secondary | ICD-10-CM

## 2023-05-19 NOTE — Written Directive (Addendum)
 MOLECULAR IMAGING AND THERAPEUTICS WRITTEN DIRECTIVE   PATIENT NAME: Nichole [name]  PT DOB:   [birth year]                                              MRN: [id number]  ---------------------------------------------------------------------------------------------------------------------   I-131 WHOLE THYROID  THERAPY (NON-CANCER)    RADIOPHARMACEUTICAL:   Iodine-131 Capsule    PRESCRIBED DOSE FOR ADMINISTRATION: 25 mCi   ROUTE OFADMINISTRATION: PO   DIAGNOSIS:  Hyperthyroidism   REFERRING PHYSICIAN: Bindubal Balan   TSH: <0.005 Lab Results  Component Value Date   TSH 3.00 02/26/2019   TSH 2.53 01/30/2018   TSH 1.98 01/10/2017     PRIOR I-131 THERAPY (Date and Dose):   PRIOR RADIOLOGY EXAMS (Results and Date): NM THYROID  MULT UPTAKE W/IMAGING Result Date: 05/17/2023 CLINICAL DATA:  Subclinical hyperthyroidism. TSH equal 0.005. Hyperthyroid and hyporthyroid symptoms including fatigue and lethargy. Hand tremors. Nervousness and weight loss. Sore throat. EXAM: THYROID  SCAN AND UPTAKE - 4 AND 24 HOURS TECHNIQUE: Following oral administration of I-123 capsule, anterior planar imaging was acquired at 24 hours. Thyroid  uptake was calculated with a thyroid  probe at 4-6 hours and 24 hours. RADIOPHARMACEUTICALS:  Four hundred fifty-seven uCi I-123 sodium iodide p.o. COMPARISON:  None Available. FINDINGS: Homogeneous radiotracer activity within the thyroid  gland. No nodularity. Normal volume glands. 4 hour I-123 uptake = 7.2% (normal 5-20%) 24 hour I-123 uptake = 20.2% (normal 10-30%) IMPRESSION: Normal thyroid  gland by imaging and iodine uptake. Electronically Signed   By: Deboraha Fallow M.D.   On: 05/17/2023 13:40      ADDITIONAL PHYSICIAN COMMENTS/NOTES  CLINICAL DATA:  Subclinical hyperthyroidism. TSH equal 0.005. Hyperthyroid and hyporthyroid symptoms including fatigue and lethargy. Hand tremors. Nervousness and weight loss. Sore throat.   Normal uptake and scan. AUTHORIZED  USER SIGNATURE & TIME STAMP: Reino Carbo, MD   05/22/23    12:20 PM

## 2023-06-01 ENCOUNTER — Encounter (HOSPITAL_COMMUNITY)
Admission: RE | Admit: 2023-06-01 | Discharge: 2023-06-01 | Disposition: A | Discharge status: Home / Self Care | Source: Ambulatory Visit | Attending: Endocrinology | Admitting: Endocrinology

## 2023-06-05 DIAGNOSIS — E0590 Thyrotoxicosis, unspecified without thyrotoxic crisis or storm: Secondary | ICD-10-CM | POA: Diagnosis not present

## 2023-06-22 DIAGNOSIS — Z1231 Encounter for screening mammogram for malignant neoplasm of breast: Secondary | ICD-10-CM | POA: Diagnosis not present

## 2023-06-27 DIAGNOSIS — E7800 Pure hypercholesterolemia, unspecified: Secondary | ICD-10-CM | POA: Diagnosis not present

## 2023-06-27 DIAGNOSIS — E559 Vitamin D deficiency, unspecified: Secondary | ICD-10-CM | POA: Diagnosis not present

## 2023-07-04 DIAGNOSIS — E7800 Pure hypercholesterolemia, unspecified: Secondary | ICD-10-CM | POA: Diagnosis not present

## 2023-07-04 DIAGNOSIS — I10 Essential (primary) hypertension: Secondary | ICD-10-CM | POA: Diagnosis not present

## 2023-07-04 DIAGNOSIS — R7989 Other specified abnormal findings of blood chemistry: Secondary | ICD-10-CM | POA: Diagnosis not present

## 2023-07-04 DIAGNOSIS — F5101 Primary insomnia: Secondary | ICD-10-CM | POA: Diagnosis not present

## 2023-07-04 DIAGNOSIS — E0590 Thyrotoxicosis, unspecified without thyrotoxic crisis or storm: Secondary | ICD-10-CM | POA: Diagnosis not present

## 2023-07-04 DIAGNOSIS — R7303 Prediabetes: Secondary | ICD-10-CM | POA: Diagnosis not present

## 2023-07-04 DIAGNOSIS — Z0000 Encounter for general adult medical examination without abnormal findings: Secondary | ICD-10-CM | POA: Diagnosis not present

## 2023-08-31 DIAGNOSIS — E0590 Thyrotoxicosis, unspecified without thyrotoxic crisis or storm: Secondary | ICD-10-CM | POA: Diagnosis not present

## 2023-09-05 DIAGNOSIS — E0590 Thyrotoxicosis, unspecified without thyrotoxic crisis or storm: Secondary | ICD-10-CM | POA: Diagnosis not present

## 2023-09-12 DIAGNOSIS — E0590 Thyrotoxicosis, unspecified without thyrotoxic crisis or storm: Secondary | ICD-10-CM | POA: Diagnosis not present

## 2023-09-13 DIAGNOSIS — R413 Other amnesia: Secondary | ICD-10-CM | POA: Diagnosis not present

## 2023-09-13 DIAGNOSIS — F5101 Primary insomnia: Secondary | ICD-10-CM | POA: Diagnosis not present

## 2023-09-26 DIAGNOSIS — L820 Inflamed seborrheic keratosis: Secondary | ICD-10-CM | POA: Diagnosis not present

## 2023-09-26 DIAGNOSIS — D2271 Melanocytic nevi of right lower limb, including hip: Secondary | ICD-10-CM | POA: Diagnosis not present

## 2023-09-26 DIAGNOSIS — Z85828 Personal history of other malignant neoplasm of skin: Secondary | ICD-10-CM | POA: Diagnosis not present

## 2023-09-26 DIAGNOSIS — L821 Other seborrheic keratosis: Secondary | ICD-10-CM | POA: Diagnosis not present

## 2023-09-26 DIAGNOSIS — D2272 Melanocytic nevi of left lower limb, including hip: Secondary | ICD-10-CM | POA: Diagnosis not present

## 2023-09-26 DIAGNOSIS — D692 Other nonthrombocytopenic purpura: Secondary | ICD-10-CM | POA: Diagnosis not present

## 2023-09-26 DIAGNOSIS — D225 Melanocytic nevi of trunk: Secondary | ICD-10-CM | POA: Diagnosis not present
